# Patient Record
Sex: Female | Born: 1970 | Race: White | Hispanic: No | Smoking: Current every day smoker
Health system: Southern US, Community
[De-identification: ages and names within clinical notes are randomized; demographics above are authoritative.]

## PROBLEM LIST (undated history)

## (undated) DIAGNOSIS — G4700 Insomnia, unspecified: Secondary | ICD-10-CM

## (undated) DIAGNOSIS — E785 Hyperlipidemia, unspecified: Secondary | ICD-10-CM

## (undated) DIAGNOSIS — M797 Fibromyalgia: Secondary | ICD-10-CM

## (undated) DIAGNOSIS — E079 Disorder of thyroid, unspecified: Secondary | ICD-10-CM

## (undated) DIAGNOSIS — T7840XA Allergy, unspecified, initial encounter: Secondary | ICD-10-CM

## (undated) DIAGNOSIS — R609 Edema, unspecified: Secondary | ICD-10-CM

## (undated) DIAGNOSIS — G8929 Other chronic pain: Secondary | ICD-10-CM

## (undated) DIAGNOSIS — M1990 Unspecified osteoarthritis, unspecified site: Secondary | ICD-10-CM

## (undated) DIAGNOSIS — I10 Essential (primary) hypertension: Secondary | ICD-10-CM

## (undated) DIAGNOSIS — H409 Unspecified glaucoma: Secondary | ICD-10-CM

## (undated) DIAGNOSIS — M545 Low back pain, unspecified: Secondary | ICD-10-CM

## (undated) DIAGNOSIS — R600 Localized edema: Secondary | ICD-10-CM

## (undated) DIAGNOSIS — J45909 Unspecified asthma, uncomplicated: Secondary | ICD-10-CM

## (undated) HISTORY — DX: Essential (primary) hypertension: I10

## (undated) HISTORY — DX: Fibromyalgia: M79.7

## (undated) HISTORY — DX: Low back pain, unspecified: M54.50

## (undated) HISTORY — PX: OTHER SURGICAL HISTORY: SHX169

## (undated) HISTORY — DX: Allergy, unspecified, initial encounter: T78.40XA

## (undated) HISTORY — PX: CARPAL TUNNEL RELEASE: SHX101

## (undated) HISTORY — DX: Disorder of thyroid, unspecified: E07.9

## (undated) HISTORY — DX: Insomnia, unspecified: G47.00

## (undated) HISTORY — PX: ANTERIOR CRUCIATE LIGAMENT REPAIR: SHX115

## (undated) HISTORY — PX: CERVICAL SPINE SURGERY: SHX589

## (undated) HISTORY — DX: Other chronic pain: G89.29

## (undated) HISTORY — DX: Edema, unspecified: R60.9

## (undated) HISTORY — PX: ENDOMETRIAL ABLATION: SHX621

## (undated) HISTORY — DX: Hyperlipidemia, unspecified: E78.5

## (undated) HISTORY — DX: Unspecified asthma, uncomplicated: J45.909

## (undated) HISTORY — DX: Localized edema: R60.0

## (undated) HISTORY — PX: TUBAL LIGATION: SHX77

## (undated) HISTORY — DX: Low back pain: M54.5

---

## 2001-05-31 ENCOUNTER — Other Ambulatory Visit: Admission: RE | Admit: 2001-05-31 | Discharge: 2001-05-31 | Payer: Self-pay | Admitting: Family Medicine

## 2002-08-14 ENCOUNTER — Other Ambulatory Visit: Admission: RE | Admit: 2002-08-14 | Discharge: 2002-08-14 | Payer: Self-pay | Admitting: Family Medicine

## 2003-08-20 ENCOUNTER — Other Ambulatory Visit: Admission: RE | Admit: 2003-08-20 | Discharge: 2003-08-20 | Payer: Self-pay | Admitting: Family Medicine

## 2004-06-29 ENCOUNTER — Ambulatory Visit (HOSPITAL_COMMUNITY): Admission: RE | Admit: 2004-06-29 | Discharge: 2004-06-29 | Payer: Self-pay | Admitting: Family Medicine

## 2004-08-30 ENCOUNTER — Ambulatory Visit (HOSPITAL_COMMUNITY): Admission: RE | Admit: 2004-08-30 | Discharge: 2004-08-30 | Payer: Self-pay | Admitting: Family Medicine

## 2004-09-02 ENCOUNTER — Other Ambulatory Visit: Admission: RE | Admit: 2004-09-02 | Discharge: 2004-09-02 | Payer: Self-pay | Admitting: Family Medicine

## 2004-10-25 ENCOUNTER — Ambulatory Visit (HOSPITAL_COMMUNITY): Admission: RE | Admit: 2004-10-25 | Discharge: 2004-10-25 | Payer: Self-pay | Admitting: Gastroenterology

## 2005-09-13 ENCOUNTER — Other Ambulatory Visit: Admission: RE | Admit: 2005-09-13 | Discharge: 2005-09-13 | Payer: Self-pay | Admitting: Family Medicine

## 2005-12-28 ENCOUNTER — Encounter: Admission: RE | Admit: 2005-12-28 | Discharge: 2005-12-28 | Payer: Self-pay | Admitting: Neurological Surgery

## 2006-08-16 ENCOUNTER — Encounter: Admission: RE | Admit: 2006-08-16 | Discharge: 2006-08-16 | Payer: Self-pay | Admitting: Neurological Surgery

## 2006-08-30 ENCOUNTER — Ambulatory Visit (HOSPITAL_COMMUNITY): Admission: RE | Admit: 2006-08-30 | Discharge: 2006-08-31 | Payer: Self-pay | Admitting: Neurological Surgery

## 2006-10-02 ENCOUNTER — Encounter: Admission: RE | Admit: 2006-10-02 | Discharge: 2006-10-02 | Payer: Self-pay | Admitting: Neurological Surgery

## 2006-10-24 ENCOUNTER — Encounter: Admission: RE | Admit: 2006-10-24 | Discharge: 2006-10-24 | Payer: Self-pay | Admitting: Neurological Surgery

## 2006-12-04 ENCOUNTER — Encounter: Admission: RE | Admit: 2006-12-04 | Discharge: 2006-12-04 | Payer: Self-pay | Admitting: Neurological Surgery

## 2007-08-13 ENCOUNTER — Encounter: Admission: RE | Admit: 2007-08-13 | Discharge: 2007-08-13 | Payer: Self-pay | Admitting: Neurological Surgery

## 2007-11-30 ENCOUNTER — Encounter: Admission: RE | Admit: 2007-11-30 | Discharge: 2007-11-30 | Payer: Self-pay | Admitting: Neurological Surgery

## 2007-12-31 ENCOUNTER — Encounter: Admission: RE | Admit: 2007-12-31 | Discharge: 2007-12-31 | Payer: Self-pay | Admitting: Anesthesiology

## 2008-01-06 ENCOUNTER — Encounter: Admission: RE | Admit: 2008-01-06 | Discharge: 2008-01-06 | Payer: Self-pay | Admitting: Neurological Surgery

## 2008-01-17 ENCOUNTER — Ambulatory Visit (HOSPITAL_COMMUNITY): Admission: RE | Admit: 2008-01-17 | Discharge: 2008-01-18 | Payer: Self-pay | Admitting: Neurological Surgery

## 2008-02-12 ENCOUNTER — Encounter: Admission: RE | Admit: 2008-02-12 | Discharge: 2008-02-12 | Payer: Self-pay | Admitting: Neurological Surgery

## 2008-04-21 ENCOUNTER — Encounter: Admission: RE | Admit: 2008-04-21 | Discharge: 2008-04-21 | Payer: Self-pay | Admitting: Neurological Surgery

## 2009-01-02 ENCOUNTER — Encounter: Admission: RE | Admit: 2009-01-02 | Discharge: 2009-01-02 | Payer: Self-pay | Admitting: Neurological Surgery

## 2009-06-15 IMAGING — CR DG CERVICAL SPINE 1V
1 series · 1 of 1 positions shown · non-contrast
Comparison: Intraoperative C-arm spot film of 01/17/2008.

CLINICAL DATA: Neck pain, history of neck surgery 01/17/2008.

CERVICAL SPINE - 1 VIEW

[w c-spine lat]
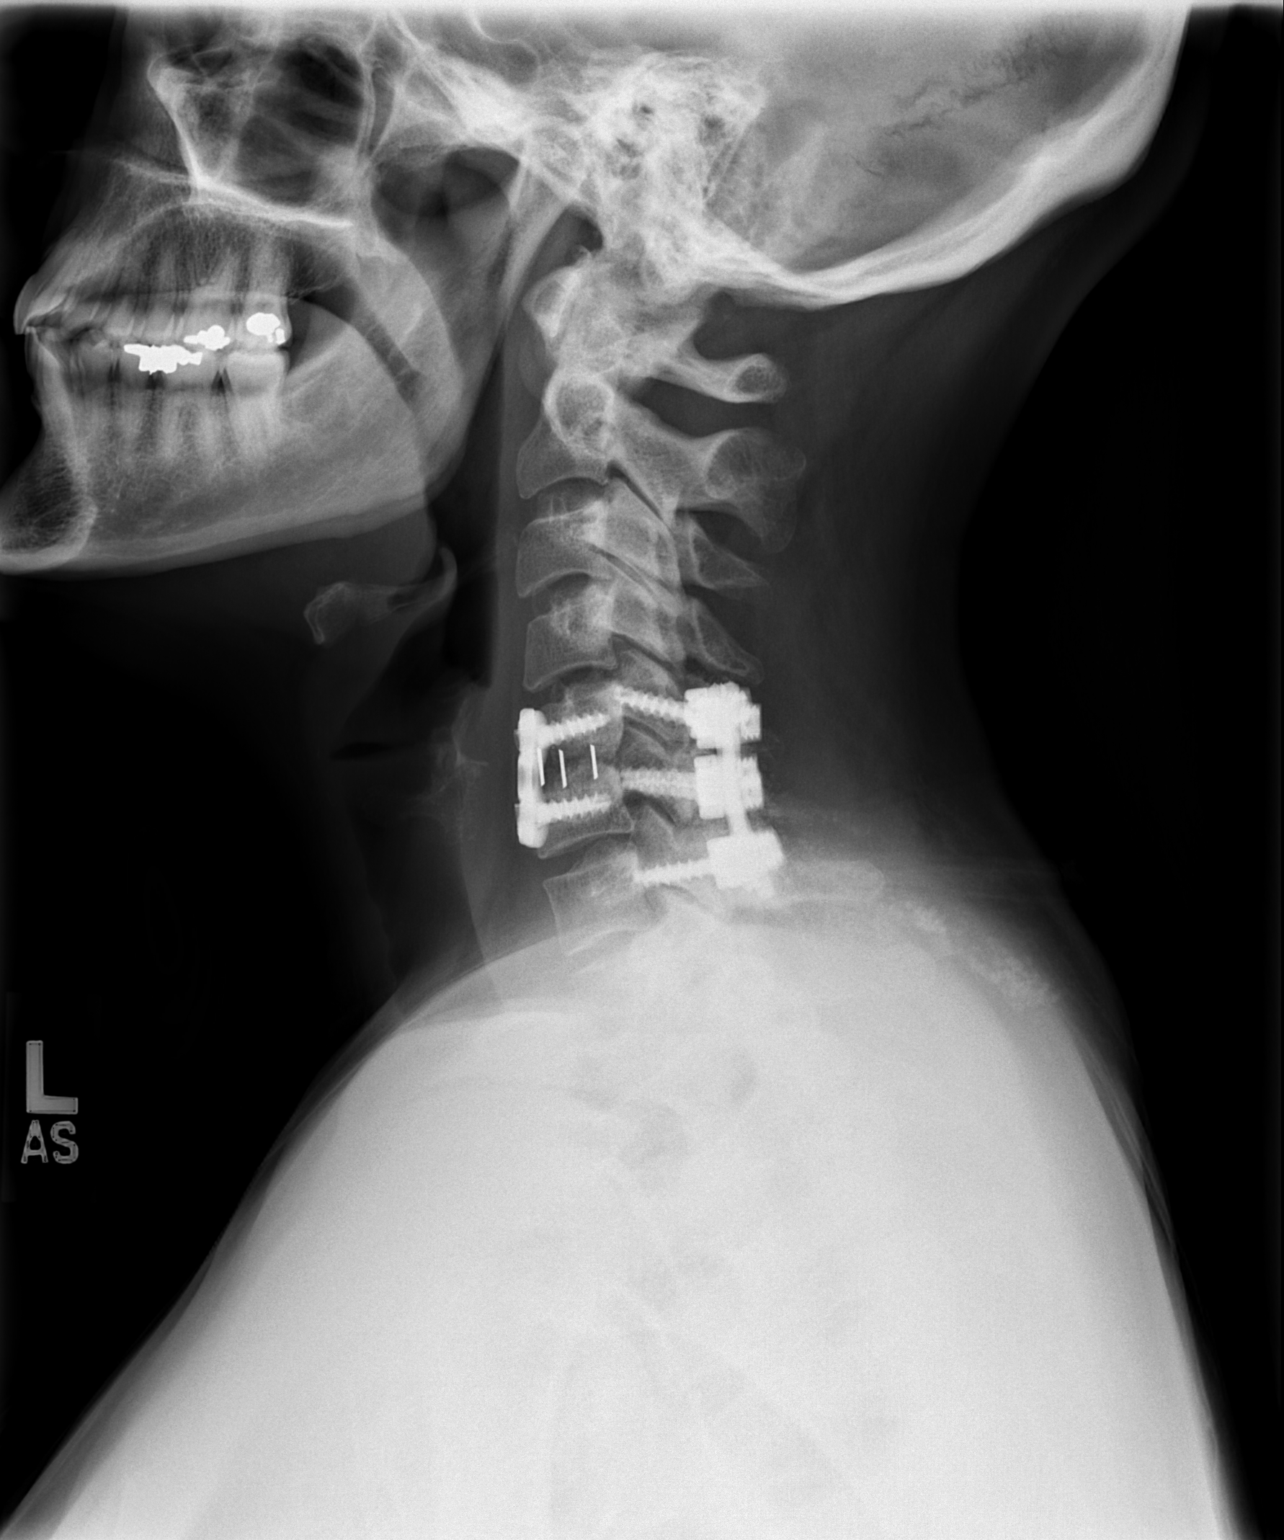

[1 of 1 positions shown; findings below may reference images not displayed]

FINDINGS: Anterior fusion is noted at C5-6.  The interbody fusion
plug is in good position as is the anterior metallic fixation
plate.  Posterior fusion is noted from C5-C7.  Normal alignment is
present.  No prevertebral soft tissue swelling is seen.
IMPRESSION: Anterior fusion at C5-6 and posterior fusion C5-6, C6-7.  Normal
alignment.

## 2010-01-29 ENCOUNTER — Encounter: Admission: RE | Admit: 2010-01-29 | Discharge: 2010-01-29 | Payer: Self-pay | Admitting: Neurological Surgery

## 2010-09-27 ENCOUNTER — Encounter: Admission: RE | Admit: 2010-09-27 | Discharge: 2010-09-27 | Payer: Self-pay | Admitting: *Deleted

## 2010-09-27 ENCOUNTER — Encounter: Admission: RE | Admit: 2010-09-27 | Discharge: 2010-09-27 | Payer: Self-pay | Admitting: Neurological Surgery

## 2011-03-22 NOTE — Op Note (Signed)
NAMESKYLAN, GIFT NO.:  [id number]   MEDICAL RECORD NO.:  [id number]          PATIENT TYPE:  OIB   LOCATION:  3536                         FACILITY:  [facility]   PHYSICIAN:  Tia Alert, MD     DATE OF BIRTH:  1971-04-13   DATE OF PROCEDURE:  01/17/2008  DATE OF DISCHARGE:  01/18/2008                               OPERATIVE REPORT   PREOPERATIVE DIAGNOSIS:  1. 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  2. Cervical disc herniation C6-C7.   POSTOPERATIVE DIAGNOSIS:  1. 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  2. Cervical disc herniation C6-C7.   PROCEDURE:  1. Posterior cervical fusion C5 to C7 utilizing local autograft and      Actifuse putty.  2. Segmental fixation C5 to C7 utilizing the Vertex lateral mass screw      system.   SURGEON:  Tia Alert, M.D.   ASSISTANT:  Reinaldo Meeker, M.D.   ANESTHESIA:  General endotracheal anesthesia.   COMPLICATIONS:  None apparent.   INDICATIONS FOR PROCEDURE:  [name] is a 40 year old female who had a  C5-C6 anterior cervical discectomy in the remote past.  She had return  of significant neck pain without significant radicular symptoms.  She  had a CT scan and MRI which showed a pseudoarthrosis at C5-C6, but also  a small herniated disc on the left at C6-C7.  I recommended a posterior  cervical arthrodesis with fixation to address her pseudoarthrosis, but  also wanted to include the C6-C7 level to hopefully prevent worsening of  her adjacent level disc herniation, but I did not believe she needed a  decompressive surgery because of her lack of radicular symptoms.  She  understood the risks, benefits, and expected outcome and wished to  proceed.   DESCRIPTION OF PROCEDURE:  The patient was taken to operating room and  after induction of adequate generalized endotracheal anesthesia, she was  affixed in a  three point Mayfield headrest and rolled into the prone  position on chest rolls and all pressure points were padded.  Her  cervical region was prepped with DuraPrep and draped in the usual  sterile fashion.  5 mL local anesthesia was injected and a dorsal  midline incision was made and carried down to the cervical fascia.  The  fascia was opened and the paraspinous musculature was taken down in  subperiosteal fashion to expose C5 to C7.  Intraoperative fluoroscopy  confirmed my level and then I dissected out to the lateral part of the  facet joints to expose C5, C6 and C7.  I located the lateral mass screw  entry zones 1 mm medial to the midline of the lateral mass, decorticated  this, and then drilled with a hand drill and a 14 mm drill guide in an  upward and an outward direction into the safe zone over each lateral  mass at C5, C6 and C7, and then placed 14 mm lateral mass screws into  the lateral masses of  C5, C6 and C7.  I then decorticated the lateral  masses, drilled the facet joints.  I then placed a lordotic rod into the  multiaxial screw heads of the vertex screws and locked these into  position with the locking caps and anti-torque device.  I then placed a  mixture of local autograft and Actifuse out over the lateral masses to  complete the arthrodesis after irrigating with saline solution  containing bacitracin.  We then got our final AP and lateral fluoroscopy  to check our construct and then closed the fascia with 0 Vicryl, the  subcutaneous and subcuticular tissue with 2-0 and 3-0 Vicryl, and closed  the skin with Benzoin and Steri-Strips.  The drapes removed, a sterile  dressing was applied.  The patient was taken out three point Mayfield  headrest, awakened from general anesthesia, and transferred to the  recovery room in stable condition.  At the end of the procedure, all  sponge, needle and instrument counts were correct.      Tia Alert, MD  Electronically  Signed     DSJ/MEDQ  D:  01/17/2008  T:  01/18/2008  Job:  [id number]

## 2011-03-25 NOTE — Op Note (Signed)
NAMEBHAVYA, Tammy Fuller               ACCOUNT NO.:  [id number]   MEDICAL RECORD NO.:  [id number]          PATIENT TYPE:  AMB   LOCATION:  SDS                          FACILITY:  [facility]   PHYSICIAN:  Tia Alert, MD     DATE OF BIRTH:  May 13, 1971   DATE OF PROCEDURE:  08/30/2006  DATE OF DISCHARGE:                                 OPERATIVE REPORT   PREOPERATIVE DIAGNOSIS:  Cervical disk herniation, C5-6 to the right, with  right C6 radiculopathy.   POSTOPERATIVE DIAGNOSIS:  Cervical disk herniation, C5-6 to the right, with  right C6 radiculopathy.   PROCEDURES:  1. Decompressive anterior cervical diskectomy. C5-6.  2. Anterior cervical arthrodesis, C5-6, utilizing a 6 mm PEEK interbody      cage packed with local autograft and Vitoss.  3. Anterior cervical plating, C5-6, utilizing a 23 mm Venture plate.   SURGEON:  Tia Alert, MD   ASSISTANT:  Donalee Citrin, MD   ANESTHESIA:  General endotracheal.   COMPLICATIONS:  None apparent.   INDICATIONS FOR PROCEDURE:  Tammy Fuller is a 40 year old white female who was  referred with neck pain with radiation into the right arm with numbness in  her thumb.  The pain followed a C6 distribution.  She had an MRI, which  showed a foraminal disk herniation at C5-6 on the right, and I recommended a  decompressive anterior cervical diskectomy, fusion with plating at C5-6 when  she failed medical management.  She understood the risks, benefits, and  expected outcome and wished to proceed.   DESCRIPTION OF PROCEDURE:  The patient was taken to the operating room and  after induction of adequate generalized endotracheal anesthesia, she was  placed in a supine position on the operating room table.  Her right anterior  cervical region was prepped with DuraPrep and then draped in the usual  sterile fashion.  Local anesthesia 3 mL was injected and small transverse  incision was made to the right of midline and carried down to the platysma,  which was  elevated, opened and undermined with Metzenbaum scissors.  I then  dissected a plane medial to the sternocleidomastoid muscle and internal  carotid artery and lateral to the trachea and esophagus to expose C5-6.  Intraoperative fluoroscopy confirmed my level and then longus colli muscles  were taken down bilaterally and the Shadow Line retractors were placed under  these.  The annulus was incised and the initial diskectomy was done with  pituitary rongeurs and curved curettes.  We then used the high-speed drill  to drill the endplates down to the level of the posterior longitudinal  ligament.  The drill shavings were saved in a mucous trap for later  arthrodesis.  They were mixed with Vitoss.  We then brought in the operating  microscope, opened the posterior longitudinal ligament with a nerve hook and  then removed it in a circumferential fashion while undercutting the bodies  C5 and C6 to decompress the central canal.  We identified the left C6 nerve  root and palpated near the foramen to assure adequate decompression with the  nerve hook and then we paid particular attention to the right side, where we  found several pieces of fragments sitting in the foramen at C5-6 the right,  compressing the right C6 nerve root.  These were removed with a nerve hook  and with the Kerrison punch.  A wide foraminotomy was performed at C5-6 on  the right side.  The C6 nerve was identified.  We marched until we met the  medial pedicle border and then marched along the superior surface of the  pedicle, identified the nerve root, and then decompressed it distally into  the foramen.  Once our decompression was complete, we palpated with the  Black nerve hook over the nerve root to assure adequate decompression of the  C6 nerve root on the right side.  We then irrigated with saline solution and  measured our interspace to be 6 mm.  We used a 6 mm PEEK interbody cage  packed with local autograft mixed with the  Vitoss and tapped this into  position at C5-6.  We then used a 23 mm Venture plate and placed two 13 mm  variable-angle screws into the bodies of C5 and C6, and these were locked  into position by locking mechanism in the plate.  We then checked our  construct with fluoroscopy.  We irrigated with saline solution containing  bacitracin, dried all bleeding points with bipolar cautery, and once  meticulous hemostasis was achieved closed the platysma with 3-0 Vicryl,  closed the subcutaneous and subcuticular tissue with 3-0 Vicryl and closed  the skin with Benzoin and Steri-Strips.  The drapes were removed.  A sterile  dressing was applied.  The patient was awakened from general anesthesia and  transferred to the recovery room in stable condition.  At the end of the  procedure all sponge, needle and instruments counts were correct.      Tia Alert, MD  Electronically Signed     DSJ/MEDQ  D:  08/30/2006  T:  08/31/2006  Job:  [id number]

## 2011-08-01 LAB — CBC
MCHC: 35.2
RBC: 4.42
RDW: 12.8

## 2011-08-01 LAB — DIFFERENTIAL
Basophils Absolute: 0.1
Basophils Relative: 1
Lymphocytes Relative: 30
Neutro Abs: 4.6
Neutrophils Relative %: 62

## 2011-08-01 LAB — BASIC METABOLIC PANEL
CO2: 28
Calcium: 9.6
Creatinine, Ser: 0.74
GFR calc Af Amer: >60
GFR calc non Af Amer: >60

## 2011-08-01 LAB — APTT: aPTT: 30

## 2011-08-01 LAB — PROTIME-INR: INR: 0.9

## 2013-04-15 ENCOUNTER — Telehealth: Payer: Self-pay | Admitting: Nurse Practitioner

## 2013-04-15 MED ORDER — LINDANE 1 % EX LOTN: TOPICAL_LOTION | Freq: Once | CUTANEOUS | Status: DC

## 2013-04-15 NOTE — Telephone Encounter (Signed)
Mmm to address 

## 2013-04-15 NOTE — Telephone Encounter (Signed)
rx sent to pharmacy

## 2013-04-16 NOTE — Telephone Encounter (Signed)
Parent aware of med at [facility]

## 2013-05-20 ENCOUNTER — Encounter: Payer: Self-pay | Admitting: Nurse Practitioner

## 2013-05-20 ENCOUNTER — Ambulatory Visit (INDEPENDENT_AMBULATORY_CARE_PROVIDER_SITE_OTHER): Payer: Medicaid Other | Admitting: Nurse Practitioner

## 2013-05-20 VITALS — BP 130/92 | HR 84 | Temp 98.7°F | Ht 70.0 in | Wt 206.0 lb

## 2013-05-20 DIAGNOSIS — G8929 Other chronic pain: Secondary | ICD-10-CM

## 2013-05-20 DIAGNOSIS — J449 Chronic obstructive pulmonary disease, unspecified: Secondary | ICD-10-CM

## 2013-05-20 DIAGNOSIS — IMO0001 Reserved for inherently not codable concepts without codable children: Secondary | ICD-10-CM | POA: Insufficient documentation

## 2013-05-20 DIAGNOSIS — E039 Hypothyroidism, unspecified: Secondary | ICD-10-CM

## 2013-05-20 DIAGNOSIS — M549 Dorsalgia, unspecified: Secondary | ICD-10-CM

## 2013-05-20 DIAGNOSIS — Z124 Encounter for screening for malignant neoplasm of cervix: Secondary | ICD-10-CM

## 2013-05-20 DIAGNOSIS — Z0000 Encounter for general adult medical examination without abnormal findings: Secondary | ICD-10-CM

## 2013-05-20 DIAGNOSIS — I10 Essential (primary) hypertension: Secondary | ICD-10-CM | POA: Insufficient documentation

## 2013-05-20 DIAGNOSIS — Z01419 Encounter for gynecological examination (general) (routine) without abnormal findings: Secondary | ICD-10-CM

## 2013-05-20 DIAGNOSIS — G4700 Insomnia, unspecified: Secondary | ICD-10-CM

## 2013-05-20 DIAGNOSIS — E785 Hyperlipidemia, unspecified: Secondary | ICD-10-CM | POA: Insufficient documentation

## 2013-05-20 LAB — POCT CBC
Lymph, poc: 2.3 (ref 0.6–3.4)
MCH, POC: 35.8 pg — AB (ref 27–31.2)
MCV: 97.9 fL — AB (ref 80–97)
Platelet Count, POC: 280 10*3/uL (ref 142–424)
RBC: 4.2 M/uL (ref 4.04–5.48)
RDW, POC: 13.1 %
WBC: 6.7 10*3/uL (ref 4.6–10.2)

## 2013-05-20 LAB — POCT URINALYSIS DIPSTICK
Blood, UA: NEGATIVE
Nitrite, UA: NEGATIVE
Protein, UA: NEGATIVE
Urobilinogen, UA: NEGATIVE
pH, UA: 6.5

## 2013-05-20 LAB — POCT UA - MICROSCOPIC ONLY
Casts, Ur, LPF, POC: NEGATIVE
Crystals, Ur, HPF, POC: NEGATIVE
Yeast, UA: NEGATIVE

## 2013-05-20 MED ORDER — FENOFIBRATE 145 MG PO TABS: 145.0000 mg | ORAL_TABLET | Freq: Every day | ORAL | Status: DC

## 2013-05-20 MED ORDER — ALBUTEROL SULFATE HFA 108 (90 BASE) MCG/ACT IN AERS: 2.0000 | INHALATION_SPRAY | Freq: Four times a day (QID) | RESPIRATORY_TRACT | Status: AC | PRN

## 2013-05-20 MED ORDER — MELOXICAM 15 MG PO TABS: 15.0000 mg | ORAL_TABLET | Freq: Every day | ORAL | Status: DC

## 2013-05-20 MED ORDER — LEVOTHYROXINE SODIUM 125 MCG PO TABS: 250.0000 ug | ORAL_TABLET | Freq: Every day | ORAL | Status: AC

## 2013-05-20 MED ORDER — LISINOPRIL-HYDROCHLOROTHIAZIDE 20-12.5 MG PO TABS: 1.0000 | ORAL_TABLET | Freq: Every day | ORAL | Status: AC

## 2013-05-20 MED ORDER — CYCLOBENZAPRINE HCL 10 MG PO TABS: 10.0000 mg | ORAL_TABLET | Freq: Three times a day (TID) | ORAL | Status: DC | PRN

## 2013-05-20 MED ORDER — ZOLPIDEM TARTRATE 10 MG PO TABS: 10.0000 mg | ORAL_TABLET | Freq: Every evening | ORAL | Status: DC | PRN

## 2013-05-20 NOTE — Progress Notes (Signed)
Subjective:    Patient ID: Tammy Fuller, female    DOB: 1970/12/11, 42 y.o.   MRN: [id number]  HPI Patient here today for CP and PAP- SHe is doing well. SHe has not been talking her meds as rx due to expense and no insurance- She has been dieting however and has lost 70 lbs. She has no compliants today. Patient Active Problem List   Diagnosis Date Noted  . Hyperlipidemia 05/20/2013  . Hypertension 05/20/2013  . Hypothyroidism 05/20/2013  . 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 albuterol (PROVENTIL HFA;VENTOLIN HFA) 108 (90 BASE) MCG/ACT inhaler Inhale 2 puffs into the lungs every 6 (six) hours as needed for wheezing.      . cyclobenzaprine (FLEXERIL) 10 MG tablet Take 10 mg by mouth 3 (three) times daily as needed for muscle spasms.      . Fenofibrate (LIPOFEN PO) Take 40 mg by mouth.      . furosemide (LASIX) 20 MG tablet Take 20 mg by mouth as needed.      Marland Kitchen HYDROcodone-acetaminophen (NORCO) 7.5-325 MG per tablet Take 1 tablet by mouth every 6 (six) hours as needed for pain.      Marland Kitchen levothyroxine (SYNTHROID, LEVOTHROID) 125 MCG tablet Take 250 mcg by mouth daily before breakfast.      . lisinopril-hydrochlorothiazide (PRINZIDE,ZESTORETIC) 20-12.5 MG per tablet Take 1 tablet by mouth daily.      . meloxicam (MOBIC) 15 MG tablet Take 15 mg by mouth daily.      Marland Kitchen zolpidem (AMBIEN) 10 MG tablet Take 10 mg by mouth at bedtime as needed for sleep.      . [DISCONTINUED] lindane lotion (KWELL) 1 % Apply topically once.  30 mL  0   No facility-administered encounter medications on file as of 05/20/2013.       Review of Systems  All other systems reviewed and are negative.       Objective:   Physical Exam  Constitutional: She is oriented to person, place, and time. She appears well-developed and well-nourished.  HENT:  Head: Normocephalic.  Right Ear: Hearing, tympanic membrane, external ear and ear canal  normal.  Left Ear: Hearing, tympanic membrane, external ear and ear canal normal.  Nose: Nose normal.  Mouth/Throat: Uvula is midline and oropharynx is clear and moist.  Eyes: Conjunctivae and EOM are normal. Pupils are equal, round, and reactive to light.  Neck: Normal range of motion and full passive range of motion without pain. Neck supple. No JVD present. Carotid bruit is not present. No mass and no thyromegaly present.  Cardiovascular: Normal rate, normal heart sounds and intact distal pulses.   No murmur heard. Pulmonary/Chest: Effort normal and breath sounds normal. Right breast exhibits no inverted nipple, no mass, no nipple discharge, no skin change and no tenderness. Left breast exhibits no inverted nipple, no mass, no nipple discharge, no skin change and no tenderness.  Abdominal: Soft. Bowel sounds are normal. She exhibits no mass. There is no tenderness.  Genitourinary: Vagina normal and uterus normal. No breast swelling, tenderness, discharge or bleeding.  bimanual exam-No adnexal masses or tenderness.  Cervical os parous and pink  Musculoskeletal: Normal range of motion.  Lymphadenopathy:    She has no cervical adenopathy.  Neurological: She is alert and oriented to person, place, and time.  Skin: Skin is warm and dry.  Psychiatric: She has a normal mood and affect. Her behavior is normal. Judgment and  thought content normal.    BP 130/92  Pulse 84  Temp(Src) 98.7 F (37.1 C) (Oral)  Ht 5\' 10"  (1.778 m)  Wt 206 lb (93.441 kg)  BMI 29.56 kg/m2       Assessment & Plan:  1. Annual physical exam  - POCT urinalysis dipstick - POCT UA - Microscopic Only - POCT CBC  2. Hyperlipidemia Low fat diet and exercise - NMR Lipoprofile with Lipids - fenofibrate (TRICOR) 145 MG tablet; Take 1 tablet (145 mg total) by mouth daily.  Dispense: 30 tablet; Refill: 5  3. Hypertension Low NA+ diet - COMPLETE METABOLIC PANEL WITH GFR - lisinopril-hydrochlorothiazide  (PRINZIDE,ZESTORETIC) 20-12.5 MG per tablet; Take 1 tablet by mouth daily.  Dispense: 30 tablet; Refill: 5  4. Hypothyroidism Must take meds daily or they will not work - Thyroid Panel With TSH - levothyroxine (SYNTHROID, LEVOTHROID) 125 MCG tablet; Take 2 tablets (250 mcg total) by mouth daily before breakfast.  Dispense: 30 tablet; Refill: 5  5. COPD bronchitis No smoking - albuterol (PROVENTIL HFA;VENTOLIN HFA) 108 (90 BASE) MCG/ACT inhaler; Inhale 2 puffs into the lungs every 6 (six) hours as needed for wheezing.  Dispense: 1 Inhaler; Refill: 5  6. Back pain, chronic exercise - meloxicam (MOBIC) 15 MG tablet; Take 1 tablet (15 mg total) by mouth daily.  Dispense: 30 tablet; Refill: 5  7. Insomnia Bedtime ritual - zolpidem (AMBIEN) 10 MG tablet; Take 1 tablet (10 mg total) by mouth at bedtime as needed for sleep.  Dispense: 30 tablet; Refill: 2  8. Encounter for routine gynecological examination  - Pap IG, CT/NG w/ reflex HPV when ASC-U  Mary-Margaret Daphine Deutscher, FNP

## 2013-05-20 NOTE — Patient Instructions (Signed)
Smoking Cessation Quitting smoking is important to your health and has many advantages. However, it is not always easy to quit since nicotine is a very addictive drug. Often times, people try 3 times or more before being able to quit. This document explains the best ways for you to prepare to quit smoking. Quitting takes hard work and a lot of effort, but you can do it. ADVANTAGES OF QUITTING SMOKING  You will live longer, feel better, and live better.  Your body will feel the impact of quitting smoking almost immediately.  Within 20 minutes, blood pressure decreases. Your pulse returns to its normal level.  After 8 hours, carbon monoxide levels in the blood return to normal. Your oxygen level increases.  After 24 hours, the chance of having a heart attack starts to decrease. Your breath, hair, and body stop smelling like smoke.  After 48 hours, damaged nerve endings begin to recover. Your sense of taste and smell improve.  After 72 hours, the body is virtually free of nicotine. Your bronchial tubes relax and breathing becomes easier.  After 2 to 12 weeks, lungs can hold more air. Exercise becomes easier and circulation improves.  The risk of having a heart attack, stroke, cancer, or lung disease is greatly reduced.  After 1 year, the risk of coronary heart disease is cut in half.  After 5 years, the risk of stroke falls to the same as a nonsmoker.  After 10 years, the risk of lung cancer is cut in half and the risk of other cancers decreases significantly.  After 15 years, the risk of coronary heart disease drops, usually to the level of a nonsmoker.  If you are pregnant, quitting smoking will improve your chances of having a healthy baby.  The people you live with, especially any children, will be healthier.  You will have extra money to spend on things other than cigarettes. QUESTIONS TO THINK ABOUT BEFORE ATTEMPTING TO QUIT You may want to talk about your answers with your  caregiver.  Why do you want to quit?  If you tried to quit in the past, what helped and what did not?  What will be the most difficult situations for you after you quit? How will you plan to handle them?  Who can help you through the tough times? Your family? Friends? A caregiver?  What pleasures do you get from smoking? What ways can you still get pleasure if you quit? Here are some questions to ask your caregiver:  How can you help me to be successful at quitting?  What medicine do you think would be best for me and how should I take it?  What should I do if I need more help?  What is smoking withdrawal like? How can I get information on withdrawal? GET READY  Set a quit date.  Change your environment by getting rid of all cigarettes, ashtrays, matches, and lighters in your home, car, or work. Do not let people smoke in your home.  Review your past attempts to quit. Think about what worked and what did not. GET SUPPORT AND ENCOURAGEMENT You have a better chance of being successful if you have help. You can get support in many ways.  Tell your family, friends, and co-workers that you are going to quit and need their support. Ask them not to smoke around you.  Get individual, group, or telephone counseling and support. Programs are available at local hospitals and health centers. Call your local health department for   information about programs in your area.  Spiritual beliefs and practices may help some smokers quit.  Download a "quit meter" on your computer to keep track of quit statistics, such as how long you have gone without smoking, cigarettes not smoked, and money saved.  Get a self-help book about quitting smoking and staying off of tobacco. LEARN NEW SKILLS AND BEHAVIORS  Distract yourself from urges to smoke. Talk to someone, go for a walk, or occupy your time with a task.  Change your normal routine. Take a different route to work. Drink tea instead of coffee.  Eat breakfast in a different place.  Reduce your stress. Take a hot bath, exercise, or read a book.  Plan something enjoyable to do every day. Reward yourself for not smoking.  Explore interactive web-based programs that specialize in helping you quit. GET MEDICINE AND USE IT CORRECTLY Medicines can help you stop smoking and decrease the urge to smoke. Combining medicine with the above behavioral methods and support can greatly increase your chances of successfully quitting smoking.  Nicotine replacement therapy helps deliver nicotine to your body without the negative effects and risks of smoking. Nicotine replacement therapy includes nicotine gum, lozenges, inhalers, nasal sprays, and skin patches. Some may be available over-the-counter and others require a prescription.  Antidepressant medicine helps people abstain from smoking, but how this works is unknown. This medicine is available by prescription.  Nicotinic receptor partial agonist medicine simulates the effect of nicotine in your brain. This medicine is available by prescription. Ask your caregiver for advice about which medicines to use and how to use them based on your health history. Your caregiver will tell you what side effects to look out for if you choose to be on a medicine or therapy. Carefully read the information on the package. Do not use any other product containing nicotine while using a nicotine replacement product.  RELAPSE OR DIFFICULT SITUATIONS Most relapses occur within the first 3 months after quitting. Do not be discouraged if you start smoking again. Remember, most people try several times before finally quitting. You may have symptoms of withdrawal because your body is used to nicotine. You may crave cigarettes, be irritable, feel very hungry, cough often, get headaches, or have difficulty concentrating. The withdrawal symptoms are only temporary. They are strongest when you first quit, but they will go away within  10 14 days. To reduce the chances of relapse, try to:  Avoid drinking alcohol. Drinking lowers your chances of successfully quitting.  Reduce the amount of caffeine you consume. Once you quit smoking, the amount of caffeine in your body increases and can give you symptoms, such as a rapid heartbeat, sweating, and anxiety.  Avoid smokers because they can make you want to smoke.  Do not let weight gain distract you. Many smokers will gain weight when they quit, usually less than 10 pounds. Eat a healthy diet and stay active. You can always lose the weight gained after you quit.  Find ways to improve your mood other than smoking. FOR MORE INFORMATION  www.smokefree.gov  Document Released: 10/18/2001 Document Revised: 04/24/2012 Document Reviewed: 02/02/2012 [employer] Patient Information 2014 [employer].  

## 2013-05-21 LAB — NMR LIPOPROFILE WITH LIPIDS
HDL Particle Number: 38.9 umol/L (ref >=30.5)
HDL-C: 64 mg/dL (ref >=40)
Large HDL-P: 10.4 umol/L (ref >=4.8)
Large VLDL-P: 3.6 nmol/L — ABNORMAL HIGH (ref <=2.7)
Triglycerides: 131 mg/dL (ref <150)

## 2013-05-21 LAB — COMPLETE METABOLIC PANEL WITH GFR
ALT: 22 U/L (ref 0–35)
Albumin: 4.8 g/dL (ref 3.5–5.2)
Alkaline Phosphatase: 45 U/L (ref 39–117)
BUN: 7 mg/dL (ref 6–23)
Chloride: 102 mEq/L (ref 96–112)
GFR, Est African American: 78 mL/min
GFR, Est Non African American: 68 mL/min
Total Bilirubin: 0.7 mg/dL (ref 0.3–1.2)
Total Protein: 7.2 g/dL (ref 6.0–8.3)

## 2013-05-21 LAB — PAP IG, CT-NG, RFX HPV ASCU

## 2013-05-21 LAB — THYROID PANEL WITH TSH
T3 Uptake: 27.3 % (ref 22.5–37.0)
T4, Total: 1.4 ug/dL — ABNORMAL LOW (ref 5.0–12.5)
TSH: 94.091 u[IU]/mL — ABNORMAL HIGH (ref 0.350–4.500)

## 2013-05-22 ENCOUNTER — Other Ambulatory Visit: Payer: Self-pay | Admitting: Nurse Practitioner

## 2013-05-22 MED ORDER — SIMVASTATIN 40 MG PO TABS: 40.0000 mg | ORAL_TABLET | Freq: Every day | ORAL | Status: AC

## 2013-05-22 NOTE — Addendum Note (Signed)
Addended by: Orma Render F on: 05/22/2013 11:51 AM   Modules accepted: Orders

## 2013-05-24 ENCOUNTER — Telehealth: Payer: Self-pay | Admitting: *Deleted

## 2013-05-24 MED ORDER — FENOFIBRATE 145 MG PO TABS: 145.0000 mg | ORAL_TABLET | Freq: Every day | ORAL | Status: DC

## 2013-05-24 NOTE — Telephone Encounter (Signed)
Ins will not cover fenofibrate unless pt has tried lopid, tricor or trilipix,  At least two of these and failed.  Can you help? Thanks

## 2013-05-24 NOTE — Telephone Encounter (Signed)
Changed to tricor- sent rx in to pharmacy

## 2013-05-27 NOTE — Telephone Encounter (Signed)
Pt aware.

## 2013-09-30 ENCOUNTER — Other Ambulatory Visit: Payer: Self-pay | Admitting: Neurological Surgery

## 2013-11-04 ENCOUNTER — Encounter (HOSPITAL_COMMUNITY): Payer: Self-pay | Admitting: Pharmacy Technician

## 2013-11-12 ENCOUNTER — Encounter (HOSPITAL_COMMUNITY): Payer: Self-pay | Admitting: *Deleted

## 2013-11-20 MED ORDER — DEXAMETHASONE SODIUM PHOSPHATE 10 MG/ML IJ SOLN
10.0000 mg | INTRAMUSCULAR | Status: AC
  Administered 2013-11-21: 10 mg via INTRAVENOUS
  Filled 2013-11-20: qty 1

## 2013-11-20 MED ORDER — CEFAZOLIN SODIUM-DEXTROSE 2-3 GM-% IV SOLR
2.0000 g | INTRAVENOUS | Status: AC
  Administered 2013-11-21: 2 g via INTRAVENOUS
  Filled 2013-11-20: qty 50

## 2013-11-20 NOTE — Progress Notes (Signed)
I spoke with patient and informed her of new arrival time of 0800.

## 2013-11-21 ENCOUNTER — Inpatient Hospital Stay (HOSPITAL_COMMUNITY): Payer: Commercial Managed Care - PPO

## 2013-11-21 ENCOUNTER — Inpatient Hospital Stay (HOSPITAL_COMMUNITY): Payer: Commercial Managed Care - PPO | Admitting: Critical Care Medicine

## 2013-11-21 ENCOUNTER — Encounter (HOSPITAL_COMMUNITY): Payer: Commercial Managed Care - PPO | Admitting: Critical Care Medicine

## 2013-11-21 ENCOUNTER — Encounter (HOSPITAL_COMMUNITY)
Admission: RE | Disposition: A | Discharge status: Home / Self Care | Payer: Self-pay | Source: Ambulatory Visit | Attending: Neurological Surgery

## 2013-11-21 ENCOUNTER — Inpatient Hospital Stay (HOSPITAL_COMMUNITY)
Admission: RE | Admit: 2013-11-21 | Discharge: 2013-11-22 | DRG: 460 | Disposition: A | Discharge status: Home / Self Care | Payer: Commercial Managed Care - PPO | Source: Ambulatory Visit | Attending: Neurological Surgery | Admitting: Neurological Surgery

## 2013-11-21 ENCOUNTER — Encounter (HOSPITAL_COMMUNITY): Payer: Self-pay | Admitting: *Deleted

## 2013-11-21 DIAGNOSIS — Z833 Family history of diabetes mellitus: Secondary | ICD-10-CM

## 2013-11-21 DIAGNOSIS — E785 Hyperlipidemia, unspecified: Secondary | ICD-10-CM | POA: Diagnosis present

## 2013-11-21 DIAGNOSIS — Z8249 Family history of ischemic heart disease and other diseases of the circulatory system: Secondary | ICD-10-CM

## 2013-11-21 DIAGNOSIS — G8929 Other chronic pain: Secondary | ICD-10-CM | POA: Diagnosis present

## 2013-11-21 DIAGNOSIS — E039 Hypothyroidism, unspecified: Secondary | ICD-10-CM | POA: Diagnosis present

## 2013-11-21 DIAGNOSIS — M7130 Other bursal cyst, unspecified site: Secondary | ICD-10-CM | POA: Diagnosis present

## 2013-11-21 DIAGNOSIS — Z9851 Tubal ligation status: Secondary | ICD-10-CM

## 2013-11-21 DIAGNOSIS — Z886 Allergy status to analgesic agent status: Secondary | ICD-10-CM

## 2013-11-21 DIAGNOSIS — Z981 Arthrodesis status: Secondary | ICD-10-CM

## 2013-11-21 DIAGNOSIS — Z79899 Other long term (current) drug therapy: Secondary | ICD-10-CM

## 2013-11-21 DIAGNOSIS — J45909 Unspecified asthma, uncomplicated: Secondary | ICD-10-CM | POA: Diagnosis present

## 2013-11-21 DIAGNOSIS — Q762 Congenital spondylolisthesis: Principal | ICD-10-CM

## 2013-11-21 DIAGNOSIS — F17200 Nicotine dependence, unspecified, uncomplicated: Secondary | ICD-10-CM | POA: Diagnosis present

## 2013-11-21 DIAGNOSIS — I10 Essential (primary) hypertension: Secondary | ICD-10-CM | POA: Diagnosis present

## 2013-11-21 DIAGNOSIS — G4700 Insomnia, unspecified: Secondary | ICD-10-CM | POA: Diagnosis present

## 2013-11-21 DIAGNOSIS — H409 Unspecified glaucoma: Secondary | ICD-10-CM | POA: Diagnosis present

## 2013-11-21 DIAGNOSIS — IMO0001 Reserved for inherently not codable concepts without codable children: Secondary | ICD-10-CM | POA: Diagnosis present

## 2013-11-21 HISTORY — PX: MAXIMUM ACCESS (MAS)POSTERIOR LUMBAR INTERBODY FUSION (PLIF) 1 LEVEL: SHX6368

## 2013-11-21 HISTORY — DX: Unspecified glaucoma: H40.9

## 2013-11-21 HISTORY — DX: Unspecified osteoarthritis, unspecified site: M19.90

## 2013-11-21 LAB — CBC WITH DIFFERENTIAL/PLATELET
BASOS PCT: 0 % (ref 0–1)
Basophils Absolute: 0 10*3/uL (ref 0.0–0.1)
EOS PCT: 2 % (ref 0–5)
Eosinophils Absolute: 0.1 10*3/uL (ref 0.0–0.7)
HEMATOCRIT: 43.2 % (ref 36.0–46.0)
Hemoglobin: 15.1 g/dL — ABNORMAL HIGH (ref 12.0–15.0)
LYMPHS PCT: 32 % (ref 12–46)
Lymphs Abs: 2.3 10*3/uL (ref 0.7–4.0)
MCH: 33.7 pg (ref 26.0–34.0)
MCHC: 35 g/dL (ref 30.0–36.0)
MCV: 96.4 fL (ref 78.0–100.0)
MONO ABS: 0.4 10*3/uL (ref 0.1–1.0)
Monocytes Relative: 6 % (ref 3–12)
NEUTROS ABS: 4.3 10*3/uL (ref 1.7–7.7)
Neutrophils Relative %: 60 % (ref 43–77)
PLATELETS: 278 10*3/uL (ref 150–400)
RBC: 4.48 MIL/uL (ref 3.87–5.11)
RDW: 12.9 % (ref 11.5–15.5)
WBC: 7.2 10*3/uL (ref 4.0–10.5)

## 2013-11-21 LAB — BASIC METABOLIC PANEL
BUN: 13 mg/dL (ref 6–23)
CALCIUM: 9.2 mg/dL (ref 8.4–10.5)
CO2: 27 mEq/L (ref 19–32)
Chloride: 101 mEq/L (ref 96–112)
Creatinine, Ser: 0.73 mg/dL (ref 0.50–1.10)
GFR calc non Af Amer: >90 mL/min (ref >90)
Glucose, Bld: 99 mg/dL (ref 70–99)
Potassium: 5.2 mEq/L (ref 3.7–5.3)
Sodium: 139 mEq/L (ref 137–147)

## 2013-11-21 LAB — PROTIME-INR
INR: 0.91 (ref 0.00–1.49)
Prothrombin Time: 12.1 seconds (ref 11.6–15.2)

## 2013-11-21 LAB — SURGICAL PCR SCREEN
MRSA, PCR: NEGATIVE
STAPHYLOCOCCUS AUREUS: POSITIVE — AB

## 2013-11-21 LAB — ABO/RH: ABO/RH(D): O POS

## 2013-11-21 LAB — TYPE AND SCREEN
ABO/RH(D): O POS
ANTIBODY SCREEN: NEGATIVE

## 2013-11-21 LAB — HCG, SERUM, QUALITATIVE: Preg, Serum: NEGATIVE

## 2013-11-21 SURGERY — FOR MAXIMUM ACCESS (MAS) POSTERIOR LUMBAR INTERBODY FUSION (PLIF) 1 LEVEL
Anesthesia: General | Site: Spine Lumbar

## 2013-11-21 MED ORDER — PROPOFOL 10 MG/ML IV BOLUS
INTRAVENOUS | Status: DC | PRN
  Administered 2013-11-21: 180 mg via INTRAVENOUS
  Administered 2013-11-21: 20 mg via INTRAVENOUS

## 2013-11-21 MED ORDER — HYDROMORPHONE HCL PF 1 MG/ML IJ SOLN
INTRAMUSCULAR | Status: AC
  Filled 2013-11-21: qty 1

## 2013-11-21 MED ORDER — SODIUM CHLORIDE 0.9 % IR SOLN
Status: DC | PRN
  Administered 2013-11-21: 12:00:00

## 2013-11-21 MED ORDER — OXYCODONE-ACETAMINOPHEN 5-325 MG PO TABS
1.0000 | ORAL_TABLET | ORAL | Status: DC | PRN
  Administered 2013-11-21 – 2013-11-22 (×4): 2 via ORAL
  Filled 2013-11-21 (×4): qty 2

## 2013-11-21 MED ORDER — DEXAMETHASONE SODIUM PHOSPHATE 4 MG/ML IJ SOLN
4.0000 mg | Freq: Four times a day (QID) | INTRAMUSCULAR | Status: DC
Start: 2013-11-21 — End: 2013-11-22
  Filled 2013-11-21 (×4): qty 1

## 2013-11-21 MED ORDER — CEFAZOLIN SODIUM 1-5 GM-% IV SOLN
1.0000 g | Freq: Three times a day (TID) | INTRAVENOUS | Status: AC
  Administered 2013-11-21 – 2013-11-22 (×2): 1 g via INTRAVENOUS
  Filled 2013-11-21 (×2): qty 50

## 2013-11-21 MED ORDER — CYCLOBENZAPRINE HCL 10 MG PO TABS
10.0000 mg | ORAL_TABLET | Freq: Three times a day (TID) | ORAL | Status: DC | PRN
  Administered 2013-11-21 – 2013-11-22 (×2): 10 mg via ORAL
  Filled 2013-11-21 (×2): qty 1

## 2013-11-21 MED ORDER — MUPIROCIN 2 % EX OINT
1.0000 "application " | TOPICAL_OINTMENT | Freq: Two times a day (BID) | CUTANEOUS | Status: DC
  Administered 2013-11-21: 1 via NASAL

## 2013-11-21 MED ORDER — LEVOTHYROXINE SODIUM 125 MCG PO TABS
250.0000 ug | ORAL_TABLET | Freq: Every day | ORAL | Status: DC
  Filled 2013-11-21 (×2): qty 2

## 2013-11-21 MED ORDER — ACETAMINOPHEN 325 MG PO TABS: 650.0000 mg | ORAL_TABLET | ORAL | Status: DC | PRN

## 2013-11-21 MED ORDER — SODIUM CHLORIDE 0.9 % IJ SOLN: 3.0000 mL | INTRAMUSCULAR | Status: DC | PRN

## 2013-11-21 MED ORDER — MUPIROCIN 2 % EX OINT
TOPICAL_OINTMENT | Freq: Two times a day (BID) | CUTANEOUS | Status: DC
  Filled 2013-11-21: qty 22

## 2013-11-21 MED ORDER — SODIUM CHLORIDE 0.9 % IV SOLN: 250.0000 mL | INTRAVENOUS | Status: DC

## 2013-11-21 MED ORDER — SUCCINYLCHOLINE CHLORIDE 20 MG/ML IJ SOLN
INTRAMUSCULAR | Status: DC | PRN
  Administered 2013-11-21: 120 mg via INTRAVENOUS

## 2013-11-21 MED ORDER — ONDANSETRON HCL 4 MG/2ML IJ SOLN
INTRAMUSCULAR | Status: DC | PRN
Start: 2013-11-21 — End: 2013-11-21
  Administered 2013-11-21: 4 mg via INTRAVENOUS

## 2013-11-21 MED ORDER — THROMBIN 20000 UNITS EX SOLR
CUTANEOUS | Status: DC | PRN
  Administered 2013-11-21: 12:00:00 via TOPICAL

## 2013-11-21 MED ORDER — ALBUTEROL SULFATE (2.5 MG/3ML) 0.083% IN NEBU: 2.5000 mg | INHALATION_SOLUTION | RESPIRATORY_TRACT | Status: DC | PRN

## 2013-11-21 MED ORDER — MORPHINE SULFATE 2 MG/ML IJ SOLN
1.0000 mg | INTRAMUSCULAR | Status: DC | PRN
  Administered 2013-11-21: 4 mg via INTRAVENOUS
  Filled 2013-11-21: qty 2

## 2013-11-21 MED ORDER — MUPIROCIN 2 % EX OINT
TOPICAL_OINTMENT | CUTANEOUS | Status: AC
  Administered 2013-11-21: 1
  Filled 2013-11-21: qty 22

## 2013-11-21 MED ORDER — BUPIVACAINE HCL (PF) 0.25 % IJ SOLN
INTRAMUSCULAR | Status: DC | PRN
  Administered 2013-11-21: 6 mL

## 2013-11-21 MED ORDER — DEXAMETHASONE 4 MG PO TABS
4.0000 mg | ORAL_TABLET | Freq: Four times a day (QID) | ORAL | Status: DC
  Administered 2013-11-21 – 2013-11-22 (×3): 4 mg via ORAL
  Filled 2013-11-21 (×7): qty 1

## 2013-11-21 MED ORDER — LIDOCAINE HCL (CARDIAC) 20 MG/ML IV SOLN
INTRAVENOUS | Status: DC | PRN
  Administered 2013-11-21: 30 mg via INTRAVENOUS

## 2013-11-21 MED ORDER — ARTIFICIAL TEARS OP OINT
TOPICAL_OINTMENT | OPHTHALMIC | Status: DC | PRN
  Administered 2013-11-21: 1 via OPHTHALMIC

## 2013-11-21 MED ORDER — DIAZEPAM 5 MG/ML IJ SOLN
INTRAMUSCULAR | Status: AC
  Administered 2013-11-21: 2.5 mg
  Filled 2013-11-21: qty 2

## 2013-11-21 MED ORDER — ACETAMINOPHEN 650 MG RE SUPP: 650.0000 mg | RECTAL | Status: DC | PRN

## 2013-11-21 MED ORDER — POTASSIUM CHLORIDE IN NACL 20-0.9 MEQ/L-% IV SOLN
INTRAVENOUS | Status: DC
  Administered 2013-11-21: 18:00:00 via INTRAVENOUS
  Filled 2013-11-21 (×3): qty 1000

## 2013-11-21 MED ORDER — LACTATED RINGERS IV SOLN
INTRAVENOUS | Status: DC
  Administered 2013-11-21 (×2): via INTRAVENOUS

## 2013-11-21 MED ORDER — HYDROMORPHONE HCL PF 1 MG/ML IJ SOLN
0.2500 mg | INTRAMUSCULAR | Status: DC | PRN
  Administered 2013-11-21 (×4): 0.5 mg via INTRAVENOUS

## 2013-11-21 MED ORDER — FENTANYL CITRATE 0.05 MG/ML IJ SOLN
INTRAMUSCULAR | Status: DC | PRN
  Administered 2013-11-21: 50 ug via INTRAVENOUS
  Administered 2013-11-21: 100 ug via INTRAVENOUS
  Administered 2013-11-21 (×5): 50 ug via INTRAVENOUS

## 2013-11-21 MED ORDER — CELECOXIB 200 MG PO CAPS
200.0000 mg | ORAL_CAPSULE | Freq: Two times a day (BID) | ORAL | Status: DC
  Administered 2013-11-21: 200 mg via ORAL
  Filled 2013-11-21 (×3): qty 1

## 2013-11-21 MED ORDER — THROMBIN 5000 UNITS EX SOLR
OROMUCOSAL | Status: DC | PRN
  Administered 2013-11-21: 12:00:00 via TOPICAL

## 2013-11-21 MED ORDER — 0.9 % SODIUM CHLORIDE (POUR BTL) OPTIME
TOPICAL | Status: DC | PRN
  Administered 2013-11-21: 1000 mL

## 2013-11-21 MED ORDER — LISINOPRIL-HYDROCHLOROTHIAZIDE 20-12.5 MG PO TABS: 1.0000 | ORAL_TABLET | Freq: Every day | ORAL | Status: DC

## 2013-11-21 MED ORDER — ONDANSETRON HCL 4 MG/2ML IJ SOLN: 4.0000 mg | INTRAMUSCULAR | Status: DC | PRN

## 2013-11-21 MED ORDER — ALBUTEROL SULFATE HFA 108 (90 BASE) MCG/ACT IN AERS
INHALATION_SPRAY | RESPIRATORY_TRACT | Status: DC | PRN
  Administered 2013-11-21 (×2): 2 via RESPIRATORY_TRACT

## 2013-11-21 MED ORDER — MENTHOL 3 MG MT LOZG
1.0000 | LOZENGE | OROMUCOSAL | Status: DC | PRN
Start: 2013-11-21 — End: 2013-11-22

## 2013-11-21 MED ORDER — LISINOPRIL 20 MG PO TABS
20.0000 mg | ORAL_TABLET | Freq: Every day | ORAL | Status: DC
  Administered 2013-11-21: 20 mg via ORAL
  Filled 2013-11-21 (×2): qty 1

## 2013-11-21 MED ORDER — PHENOL 1.4 % MT LIQD: 1.0000 | OROMUCOSAL | Status: DC | PRN

## 2013-11-21 MED ORDER — HYDROCHLOROTHIAZIDE 12.5 MG PO CAPS
12.5000 mg | ORAL_CAPSULE | Freq: Every day | ORAL | Status: DC
  Administered 2013-11-21: 12.5 mg via ORAL
  Filled 2013-11-21 (×2): qty 1

## 2013-11-21 MED ORDER — SODIUM CHLORIDE 0.9 % IJ SOLN
3.0000 mL | Freq: Two times a day (BID) | INTRAMUSCULAR | Status: DC
  Administered 2013-11-21: 3 mL via INTRAVENOUS

## 2013-11-21 MED ORDER — MIDAZOLAM HCL 5 MG/5ML IJ SOLN
INTRAMUSCULAR | Status: DC | PRN
  Administered 2013-11-21: 2 mg via INTRAVENOUS

## 2013-11-21 MED ORDER — ALBUTEROL SULFATE (2.5 MG/3ML) 0.083% IN NEBU: 2.5000 mg | INHALATION_SOLUTION | Freq: Four times a day (QID) | RESPIRATORY_TRACT | Status: DC | PRN

## 2013-11-21 SURGICAL SUPPLY — 69 items
APL SKNCLS STERI-STRIP NONHPOA (GAUZE/BANDAGES/DRESSINGS) ×1
BAG DECANTER FOR FLEXI CONT (MISCELLANEOUS) ×3 IMPLANT
BAG URIMETER 350ML BARDEX IC (UROLOGICAL SUPPLIES) ×1
BAG URIMETER BARDEX IC 350 (UROLOGICAL SUPPLIES) ×1 IMPLANT
BENZOIN TINCTURE PRP APPL 2/3 (GAUZE/BANDAGES/DRESSINGS) ×3 IMPLANT
BLADE SURG ROTATE 9660 (MISCELLANEOUS) IMPLANT
BONE MATRIX OSTEOCEL PRO MED (Bone Implant) ×2 IMPLANT
BUR MATCHSTICK NEURO 3.0 LAGG (BURR) ×3 IMPLANT
CAGE COROENT PLIF 10X28-8 LUMB (Cage) ×4 IMPLANT
CANISTER SUCT 3000ML (MISCELLANEOUS) ×3 IMPLANT
CLIP NEUROVISION LG (CLIP) ×2 IMPLANT
CLOSURE WOUND 1/2 X4 (GAUZE/BANDAGES/DRESSINGS) ×1
CONT SPEC 4OZ CLIKSEAL STRL BL (MISCELLANEOUS) ×6 IMPLANT
COVER BACK TABLE 24X17X13 BIG (DRAPES) IMPLANT
COVER TABLE BACK 60X90 (DRAPES) ×3 IMPLANT
DRAPE C-ARM 42X72 X-RAY (DRAPES) ×3 IMPLANT
DRAPE C-ARMOR (DRAPES) ×3 IMPLANT
DRAPE LAPAROTOMY 100X72X124 (DRAPES) ×3 IMPLANT
DRAPE POUCH INSTRU U-SHP 10X18 (DRAPES) ×3 IMPLANT
DRAPE SURG 17X23 STRL (DRAPES) ×3 IMPLANT
DRESSING TELFA 8X3 (GAUZE/BANDAGES/DRESSINGS) ×3 IMPLANT
DRSG OPSITE 4X5.5 SM (GAUZE/BANDAGES/DRESSINGS) ×4 IMPLANT
DRSG OPSITE POSTOP 4X8 (GAUZE/BANDAGES/DRESSINGS) ×2 IMPLANT
DURAPREP 26ML APPLICATOR (WOUND CARE) ×3 IMPLANT
ELECT REM PT RETURN 9FT ADLT (ELECTROSURGICAL) ×3
ELECTRODE REM PT RTRN 9FT ADLT (ELECTROSURGICAL) ×1 IMPLANT
EVACUATOR 1/8 PVC DRAIN (DRAIN) ×3 IMPLANT
GAUZE SPONGE 4X4 16PLY XRAY LF (GAUZE/BANDAGES/DRESSINGS) IMPLANT
GLOVE BIO SURGEON STRL SZ8 (GLOVE) ×4 IMPLANT
GLOVE BIOGEL PI IND STRL 7.5 (GLOVE) IMPLANT
GLOVE BIOGEL PI IND STRL 8 (GLOVE) IMPLANT
GLOVE BIOGEL PI INDICATOR 7.5 (GLOVE) ×8
GLOVE BIOGEL PI INDICATOR 8 (GLOVE) ×4
GLOVE ECLIPSE 7.5 STRL STRAW (GLOVE) ×6 IMPLANT
GLOVE SURG SS PI 7.0 STRL IVOR (GLOVE) ×8 IMPLANT
GOWN BRE IMP SLV AUR LG STRL (GOWN DISPOSABLE) ×4 IMPLANT
GOWN BRE IMP SLV AUR XL STRL (GOWN DISPOSABLE) ×4 IMPLANT
GOWN STRL REIN 2XL LVL4 (GOWN DISPOSABLE) IMPLANT
HEMOSTAT POWDER KIT SURGIFOAM (HEMOSTASIS) ×2 IMPLANT
KIT BASIN OR (CUSTOM PROCEDURE TRAY) ×3 IMPLANT
KIT NDL NVM5 EMG ELECT (KITS) IMPLANT
KIT NEEDLE NVM5 EMG ELECT (KITS) ×1 IMPLANT
KIT NEEDLE NVM5 EMG ELECTRODE (KITS) ×2
KIT ROOM TURNOVER OR (KITS) ×3 IMPLANT
MILL MEDIUM DISP (BLADE) ×2 IMPLANT
NDL HYPO 25X1 1.5 SAFETY (NEEDLE) ×1 IMPLANT
NEEDLE HYPO 25X1 1.5 SAFETY (NEEDLE) ×3 IMPLANT
NS IRRIG 1000ML POUR BTL (IV SOLUTION) ×3 IMPLANT
PACK LAMINECTOMY NEURO (CUSTOM PROCEDURE TRAY) ×3 IMPLANT
PAD ARMBOARD 7.5X6 YLW CONV (MISCELLANEOUS) ×13 IMPLANT
ROD 50MM LUMBAR (Rod) ×4 IMPLANT
SCREW LOCK (Screw) ×12 IMPLANT
SCREW LOCK FXNS SPNE MAS PL (Screw) IMPLANT
SCREW SHANK 5.0X30MM (Screw) ×4 IMPLANT
SCREW SHANK 5.0X35 (Screw) ×4 IMPLANT
SCREW TULIP 5.5 (Screw) ×8 IMPLANT
SPONGE LAP 4X18 X RAY DECT (DISPOSABLE) IMPLANT
SPONGE SURGIFOAM ABS GEL 100 (HEMOSTASIS) ×3 IMPLANT
STRIP CLOSURE SKIN 1/2X4 (GAUZE/BANDAGES/DRESSINGS) ×3 IMPLANT
SUT VIC AB 0 CT1 18XCR BRD8 (SUTURE) ×1 IMPLANT
SUT VIC AB 0 CT1 8-18 (SUTURE) ×3
SUT VIC AB 2-0 CP2 18 (SUTURE) ×3 IMPLANT
SUT VIC AB 3-0 SH 8-18 (SUTURE) ×4 IMPLANT
SYR 20ML ECCENTRIC (SYRINGE) ×3 IMPLANT
SYR 3ML LL SCALE MARK (SYRINGE) IMPLANT
TOWEL OR 17X24 6PK STRL BLUE (TOWEL DISPOSABLE) ×3 IMPLANT
TOWEL OR 17X26 10 PK STRL BLUE (TOWEL DISPOSABLE) ×3 IMPLANT
TRAY FOLEY CATH 14FRSI W/METER (CATHETERS) ×3 IMPLANT
WATER STERILE IRR 1000ML POUR (IV SOLUTION) ×3 IMPLANT

## 2013-11-21 NOTE — Anesthesia Preprocedure Evaluation (Addendum)
Anesthesia Evaluation  Patient identified by MRN, date of birth, ID band Patient awake    Reviewed: Allergy & Precautions, H&P , NPO status , Patient's Chart, lab work & pertinent test results  Airway Mallampati: II      Dental   Pulmonary asthma , COPDCurrent Smoker,  breath sounds clear to auscultation        Cardiovascular hypertension, Rhythm:Regular Rate:Normal     Neuro/Psych    GI/Hepatic negative GI ROS, Neg liver ROS,   Endo/Other  Hypothyroidism   Renal/GU negative Renal ROS     Musculoskeletal  (+) Fibromyalgia -  Abdominal   Peds  Hematology   Anesthesia Other Findings   Reproductive/Obstetrics                          Anesthesia Physical Anesthesia Plan  ASA: III  Anesthesia Plan: General   Post-op Pain Management:    Induction: Intravenous  Airway Management Planned: Oral ETT  Additional Equipment:   Intra-op Plan:   Post-operative Plan: Possible Post-op intubation/ventilation  Informed Consent:   Plan Discussed with: CRNA, Anesthesiologist and Surgeon  Anesthesia Plan Comments:        Anesthesia Quick Evaluation

## 2013-11-21 NOTE — Anesthesia Postprocedure Evaluation (Signed)
  Anesthesia Post-op Note  Patient: Tammy Fuller  Procedure(s) Performed: Procedure(s): FOR MAXIMUM ACCESS (MAS) POSTERIOR LUMBAR INTERBODY FUSION (PLIF) 1 LEVEL,LUMBAR THREE-FOUR (N/A)  Patient Location: PACU  Anesthesia Type:General  Level of Consciousness: awake  Airway and Oxygen Therapy: Patient Spontanous Breathing  Post-op Pain: mild  Post-op Assessment: Post-op Vital signs reviewed  Post-op Vital Signs: Reviewed  Complications: No apparent anesthesia complications

## 2013-11-21 NOTE — Anesthesia Procedure Notes (Signed)
Procedure Name: Intubation Date/Time: 11/21/2013 10:37 AM Performed by: Elon AlasLEE, Matei Magnone BROWN Pre-anesthesia Checklist: Patient identified, Timeout performed, Emergency Drugs available, Suction available and Patient being monitored Patient Re-evaluated:Patient Re-evaluated prior to inductionOxygen Delivery Method: Circle system utilized Preoxygenation: Pre-oxygenation with 100% oxygen Intubation Type: IV induction Ventilation: Mask ventilation without difficulty Laryngoscope Size: Mac Grade View: Grade I Tube type: Oral Tube size: 7.5 mm Number of attempts: 1 Airway Equipment and Method: Stylet Placement Confirmation: positive ETCO2 and ETT inserted through vocal cords under direct vision Secured at: 22 cm Tube secured with: Tape Dental Injury: Teeth and Oropharynx as per pre-operative assessment

## 2013-11-21 NOTE — Progress Notes (Signed)
Called to take pt to room 3C06.  Was told they would have to call back they were receiving a patient from another unit.

## 2013-11-21 NOTE — Transfer of Care (Signed)
Immediate Anesthesia Transfer of Care Note  Patient: Tammy Fuller  Procedure(s) Performed: Procedure(s): FOR MAXIMUM ACCESS (MAS) POSTERIOR LUMBAR INTERBODY FUSION (PLIF) 1 LEVEL,LUMBAR THREE-FOUR (N/A)  Patient Location: PACU  Anesthesia Type:General  Level of Consciousness: awake, alert  and oriented  Airway & Oxygen Therapy: Patient Spontanous Breathing and Patient connected to nasal cannula oxygen  Post-op Assessment: Report given to PACU RN, Post -op Vital signs reviewed and stable and Patient moving all extremities X 4  Post vital signs: Reviewed and stable  Complications: No apparent anesthesia complications

## 2013-11-21 NOTE — Preoperative (Signed)
Beta Blockers   Reason not to administer Beta Blockers:Not Applicable 

## 2013-11-21 NOTE — Op Note (Signed)
11/21/2013  1:33 PM  PATIENT:  Tammy Fuller  43 y.o. 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 Decompressive lumbar laminectomy L3-4 requiring more work than would be required for a simple exposure of the disk for PLIF in order to adequately decompress the neural elements and address the spinal stenosis 2. Posterior lumbar interbody fusion L3-4 using PEEK interbody cages packed with morcellized allograft and autograft 3. Posterior fixation L3-4 using cortical pedicle screws.    SURGEON:  Marikay Alaravid Darleene Cumpian, MD  ASSISTANTS: Dr. Newell CoralNudelman  ANESTHESIA:  General  EBL: 125 ml  Total I/O In: 1600 [I.V.:1600] Out: 525 [Urine:400; Blood:125]  BLOOD ADMINISTERED:none  DRAINS: Hemovac   INDICATION FOR PROCEDURE: This patient presented with a long history of back pain and bilateral leg pain. She had an MRI which showed a spondylolisthesis with facet arthropathy at L3-4 spinal stenosis and a synovial cyst. Plain films with flexion extension views showed dynamic instability at L3-4. She tried medical management quite some time without significant relief. Recommended a PLIF at L3-4. Patient understood the risks, benefits, and alternatives and potential outcomes and wished to proceed.  PROCEDURE DETAILS:  The patient was brought to the operating room. After induction of generalized endotracheal anesthesia the patient was rolled into the prone position on chest rolls and all pressure points were padded. The patient's lumbar region was cleaned and then prepped with DuraPrep and draped in the usual sterile fashion. Anesthesia was injected and then a dorsal midline incision was made and carried down to the lumbosacral fascia. The fascia was opened and the paraspinous musculature was taken down in a subperiosteal fashion to expose L3-4. A  self-retaining retractor was placed. Intraoperative fluoroscopy confirmed my level, and I started with placement of the L3 cortical pedicle screws. The pedicle screw entry zones were identified utilizing surface landmarks and  AP and lateral fluoroscopy. I scored the cortex with the high-speed drill and then used the hand drill and EMG monitoring to drill an upward and outward direction into the pedicle. I then tapped line to line, and the tap was also monitored. I then placed a 5-0 x 35 mm cortical pedicle screw into the pedicles of L3 bilaterally. I then turned my attention to the decompression and the spinous process was removed and complete lumbar laminectomies, hemi- facetectomies, and foraminotomies were performed at L3. The patient had significant spinal stenosis and this required more work than would be required for a simple exposure of the disc for posterior lumbar interbody fusion. Much more generous decompression was undertaken in order to adequately decompress the neural elements and address the patient's leg pain. She also had a large synovial cyst at L3-4 on the right compressing the L3 and L4 nerve roots. I spent a suitable time teasing this away from the dura to remove it. The yellow ligament was removed to expose the underlying dura and nerve roots, and generous foraminotomies were performed to adequately decompress the neural elements. Both the exiting and traversing nerve roots were decompressed on both sides until a coronary dilator passed easily along the nerve roots. Once the decompression was complete, I turned my attention to the posterior lower lumbar interbody fusion. The epidural venous vasculature was coagulated and cut sharply. Disc space was incised and the initial discectomy was performed with pituitary rongeurs. The disc space was distracted with sequential distractors to  a height of 11 mm. We then used a series of scrapers and shavers to prepare the endplates for fusion. The midline  was prepared with Epstein curettes. Once the complete discectomy was finished, we packed an appropriate sized peek interbody cage with local autograft and morcellized allograft, gently retracted the nerve root, and tapped the cage into position at L3-4.  The midline between the cages was packed with morselized autograft and allograft. We then turned our attention to the placement of the lower pedicle screws. The pedicle screw entry zones were identified utilizing surface landmarks and fluoroscopy. I drilled into each pedicle utilizing the hand drill and EMG monitoring, and tapped each pedicle with the appropriate tap. We palpated with a ball probe to assure no break in the cortex. We then placed 5-0 x 30 mm cortical pedicle screws into the pedicles bilaterally at L4. We then placed lordotic rods into the multiaxial screw heads of the pedicle screws and locked these in position with the locking caps and anti-torque device. We then checked our construct with AP and lateral fluoroscopy. Irrigated with copious amounts of bacitracin-containing saline solution. Placed a medium Hemovac drain through separate stab incision. Inspected the nerve roots once again to assure adequate decompression, lined to the dura with Gelfoam, and closed the muscle and the fascia with 0 Vicryl. Closed the subcutaneous tissues with 2-0 Vicryl and subcuticular tissues with 3-0 Vicryl. The skin was closed with benzoin and Steri-Strips. Dressing was then applied, the patient was awakened from general anesthesia and transported to the recovery room in stable condition. At the end of the procedure all sponge, needle and instrument counts were correct.   PLAN OF CARE: Admit to inpatient   PATIENT DISPOSITION:  PACU - hemodynamically stable.   Delay start of Pharmacological VTE agent (>24hrs) due to surgical blood loss or risk of bleeding:  yes

## 2013-11-21 NOTE — H&P (Signed)
Subjective: Patient is a 43 y.o. female admitted for PLIF L3-4. Onset of symptoms was several months ago, gradually worsening since that time.  The pain is rated severe, and is located at the across the lower back and radiates to legs. The pain is described as aching and occurs intermittently. The symptoms have been progressive. Symptoms are exacerbated by exercise. MRI or CT showed stenosis/ synovial cyst, spondylolisthesis L3-4   Past Medical History  Diagnosis Date  . Thyroid disease   . Allergy   . Fibromyalgia   . Hypertension   . Hyperlipidemia   . Asthma   . Chronic low back pain   . Insomnia   . Peripheral edema   . Glaucoma     Hx: Of  . Arthritis     Past Surgical History  Procedure Laterality Date  . Cervical spine surgery      x2  . Anterior cruciate ligament repair Right   . Endometrial ablation    . Carpal tunnel release Bilateral   . Gangloin cyst Right   . Tubal ligation      Prior to Admission medications   Medication Sig Start Date End Date Taking? Authorizing Provider  albuterol (PROVENTIL HFA;VENTOLIN HFA) 108 (90 BASE) MCG/ACT inhaler Inhale 2 puffs into the lungs every 6 (six) hours as needed for wheezing. 05/20/13  Yes Mary-Margaret Daphine DeutscherMartin, FNP  cholecalciferol (VITAMIN D) 1000 UNITS tablet Take 1,000 Units by mouth daily.   Yes Historical Provider, MD  cyclobenzaprine (FLEXERIL) 10 MG tablet Take 1 tablet (10 mg total) by mouth 3 (three) times daily as needed for muscle spasms. 05/20/13  Yes Mary-Margaret Daphine DeutscherMartin, FNP  HYDROcodone-acetaminophen (NORCO) 7.5-325 MG per tablet Take 1 tablet by mouth every 6 (six) hours as needed for moderate pain.    Yes Historical Provider, MD  levothyroxine (SYNTHROID, LEVOTHROID) 125 MCG tablet Take 2 tablets (250 mcg total) by mouth daily before breakfast. 05/20/13  Yes Mary-Margaret Daphine DeutscherMartin, FNP  lisinopril-hydrochlorothiazide (PRINZIDE,ZESTORETIC) 20-12.5 MG per tablet Take 1 tablet by mouth daily. 05/20/13  Yes Mary-Margaret  Daphine DeutscherMartin, FNP  meloxicam (MOBIC) 15 MG tablet Take 1 tablet (15 mg total) by mouth daily. 05/20/13  Yes Mary-Margaret Daphine DeutscherMartin, FNP  simvastatin (ZOCOR) 40 MG tablet Take 1 tablet (40 mg total) by mouth at bedtime. 05/22/13  Yes Mary-Margaret Daphine DeutscherMartin, FNP  vitamin B-12 (CYANOCOBALAMIN) 1000 MCG tablet Take 1,000 mcg by mouth daily.   Yes Historical Provider, MD  vitamin C (ASCORBIC ACID) 500 MG tablet Take 500 mg by mouth daily.   Yes Historical Provider, MD   Allergies  Allergen Reactions  . Asa [Aspirin] Other (See Comments)    Reaction unknown    History  Substance Use Topics  . Smoking status: Current Every Day Smoker -- 1.00 packs/day    Types: Cigarettes  . Smokeless tobacco: Never Used  . Alcohol Use: Yes    Family History  Problem Relation Age of Onset  . Fibromyalgia Mother   . Hypertension Father   . Hyperlipidemia Father   . Diabetes Father   . Congestive Heart Failure Father   . Lupus Cousin      Review of Systems  Positive ROS: neg  All other systems have been reviewed and were otherwise negative with the exception of those mentioned in the HPI and as above.  Objective: Vital signs in last 24 hours: Temp:  [97.7 F (36.5 C)] 97.7 F (36.5 C) (01/15 0755) Pulse Rate:  [82] 82 (01/15 0755) Resp:  [18] 18 (01/15 0755) BP: (  127)/(100) 127/100 mmHg (01/15 0755) SpO2:  [98 %] 98 % (01/15 0853) Weight:  [97.183 kg (214 lb 4 oz)] 97.183 kg (214 lb 4 oz) (01/15 0755)  General Appearance: Alert, cooperative, no distress, appears stated age Head: Normocephalic, without obvious abnormality, atraumatic Eyes: PERRL, conjunctiva/corneas clear, EOM's intact    Neck: Supple, symmetrical, trachea midline Back: Symmetric, no curvature, ROM normal, no CVA tenderness Lungs:  respirations unlabored Heart: Regular rate and rhythm Abdomen: Soft, non-tender Extremities: Extremities normal, atraumatic, no cyanosis or edema Pulses: 2+ and symmetric all extremities Skin: Skin color,  texture, turgor normal, no rashes or lesions  NEUROLOGIC:   Mental status: Alert and oriented x4,  no aphasia, good attention span, fund of knowledge, and memory Motor Exam - grossly normal Sensory Exam - grossly normal Reflexes: 1+ Coordination - grossly normal Gait - grossly normal Balance - grossly normal Cranial Nerves: I: smell Not tested  II: visual acuity  OS: nl    OD: nl  II: visual fields Full to confrontation  II: pupils Equal, round, reactive to light  III,VII: ptosis None  III,IV,VI: extraocular muscles  Full ROM  V: mastication Normal  V: facial light touch sensation  Normal  V,VII: corneal reflex  Present  VII: facial muscle function - upper  Normal  VII: facial muscle function - lower Normal  VIII: hearing Not tested  IX: soft palate elevation  Normal  IX,X: gag reflex Present  XI: trapezius strength  5/5  XI: sternocleidomastoid strength 5/5  XI: neck flexion strength  5/5  XII: tongue strength  Normal    Data Review Lab Results  Component Value Date   WBC 7.2 11/21/2013   HGB 15.1* 11/21/2013   HCT 43.2 11/21/2013   MCV 96.4 11/21/2013   PLT 278 11/21/2013   Lab Results  Component Value Date   NA 139 11/21/2013   K 5.2 11/21/2013   CL 101 11/21/2013   CO2 27 11/21/2013   BUN 13 11/21/2013   CREATININE 0.73 11/21/2013   GLUCOSE 99 11/21/2013   Lab Results  Component Value Date   INR 0.91 11/21/2013    Assessment/Plan: Patient admitted for PLIF L3-4. Patient has failed a reasonable attempt at conservative therapy.  I explained the condition and procedure to the patient and answered any questions.  Patient wishes to proceed with procedure as planned. Understands risks/ benefits and typical outcomes of procedure.   Jafeth Mustin S 11/21/2013 10:33 AM

## 2013-11-22 MED ORDER — CYCLOBENZAPRINE HCL 10 MG PO TABS: 10.0000 mg | ORAL_TABLET | Freq: Three times a day (TID) | ORAL | Status: AC | PRN

## 2013-11-22 MED ORDER — OXYCODONE-ACETAMINOPHEN 5-325 MG PO TABS: 1.0000 | ORAL_TABLET | ORAL | Status: AC | PRN

## 2013-11-22 NOTE — Discharge Summary (Signed)
Physician Discharge Summary  Patient ID: Tammy Fuller MRN: [id number] DOB/AGE: 08-Nov-1970 43 y.o.  Admit date: 11/21/2013 Discharge date: 11/22/2013  Admission Diagnoses: spondylolisthesis/ stenosis L3-4   Discharge Diagnoses: same   Discharged Condition: good  Hospital Course: The patient was admitted on 11/21/2013 and taken to the operating room where the patient underwent PLIF L3-4. The patient tolerated the procedure well and was taken to the recovery room and then to the floor in stable condition. The hospital course was routine. There were no complications. The wound remained clean dry and intact. Pt had appropriate back soreness. No complaints of leg pain or new N/T/W. The patient remained afebrile with stable vital signs, and tolerated a regular diet. The patient continued to increase activities, and pain was well controlled with oral pain medications.   Consults: None  Significant Diagnostic Studies:  Results for orders placed during the hospital encounter of 11/21/13  SURGICAL PCR SCREEN      Result Value Range   MRSA, PCR NEGATIVE  NEGATIVE   Staphylococcus aureus POSITIVE (*) NEGATIVE  BASIC METABOLIC PANEL      Result Value Range   Sodium 139  137 - 147 mEq/L   Potassium 5.2  3.7 - 5.3 mEq/L   Chloride 101  96 - 112 mEq/L   CO2 27  19 - 32 mEq/L   Glucose, Bld 99  70 - 99 mg/dL   BUN 13  6 - 23 mg/dL   Creatinine, Ser 7.82  0.50 - 1.10 mg/dL   Calcium 9.2  8.4 - 95.6 mg/dL   GFR calc non Af Amer >90  >90 mL/min   GFR calc Af Amer >90  >90 mL/min  CBC WITH DIFFERENTIAL      Result Value Range   WBC 7.2  4.0 - 10.5 K/uL   RBC 4.48  3.87 - 5.11 MIL/uL   Hemoglobin 15.1 (*) 12.0 - 15.0 g/dL   HCT 21.3  08.6 - 57.8 %   MCV 96.4  78.0 - 100.0 fL   MCH 33.7  26.0 - 34.0 pg   MCHC 35.0  30.0 - 36.0 g/dL   RDW 46.9  62.9 - 52.8 %   Platelets 278  150 - 400 K/uL   Neutrophils Relative % 60  43 - 77 %   Neutro Abs 4.3  1.7 - 7.7 K/uL   Lymphocytes Relative 32   12 - 46 %   Lymphs Abs 2.3  0.7 - 4.0 K/uL   Monocytes Relative 6  3 - 12 %   Monocytes Absolute 0.4  0.1 - 1.0 K/uL   Eosinophils Relative 2  0 - 5 %   Eosinophils Absolute 0.1  0.0 - 0.7 K/uL   Basophils Relative 0  0 - 1 %   Basophils Absolute 0.0  0.0 - 0.1 K/uL  HCG, SERUM, QUALITATIVE      Result Value Range   Preg, Serum NEGATIVE  NEGATIVE  PROTIME-INR      Result Value Range   Prothrombin Time 12.1  11.6 - 15.2 seconds   INR 0.91  0.00 - 1.49  TYPE AND SCREEN      Result Value Range   ABO/RH(D) O POS     Antibody Screen NEG     Sample Expiration 11/24/2013    ABO/RH      Result Value Range   ABO/RH(D) O POS      Chest 2 View  11/21/2013   CLINICAL DATA:  spondylolisthesis / pre-op  EXAM: CHEST  2 VIEW  COMPARISON:  None.  FINDINGS: There is no focal parenchymal opacity, pleural effusion, or pneumothorax. The heart and mediastinal contours are unremarkable.  The osseous structures are unremarkable.  IMPRESSION: No active cardiopulmonary disease.   Electronically Signed   By: Elige Ko   On: 11/21/2013 09:38   Dg Lumbar Spine 2-3 Views  11/21/2013   CLINICAL DATA:  Posterior fusion  EXAM: LUMBAR SPINE - 2-3 VIEW; DG C-ARM 61-120 MIN  COMPARISON:  08/21/2013  FINDINGS: The lowest complete disc is labeled L5-S1. Bilateral pedicle screws and cross-table as in bar is are in place with a disc spacer at L3 and L4 for posterior fusion. No breakage or loosening of the hardware. Anatomic alignment.  IMPRESSION: L3-4 posterior fusion.   Electronically Signed   By: Maryclare Bean M.D.   On: 11/21/2013 14:12   Dg C-arm 61-120 Min  11/21/2013   CLINICAL DATA:  Posterior fusion  EXAM: LUMBAR SPINE - 2-3 VIEW; DG C-ARM 61-120 MIN  COMPARISON:  08/21/2013  FINDINGS: The lowest complete disc is labeled L5-S1. Bilateral pedicle screws and cross-table as in bar is are in place with a disc spacer at L3 and L4 for posterior fusion. No breakage or loosening of the hardware. Anatomic alignment.   IMPRESSION: L3-4 posterior fusion.   Electronically Signed   By: Maryclare Bean M.D.   On: 11/21/2013 14:12    Antibiotics:  Anti-infectives   Start     Dose/Rate Route Frequency Ordered Stop   11/21/13 2000  ceFAZolin (ANCEF) IVPB 1 g/50 mL premix     1 g 100 mL/hr over 30 Minutes Intravenous Every 8 hours 11/21/13 1634 11/22/13 0423   11/21/13 1149  bacitracin 50,000 Units in sodium chloride irrigation 0.9 % 500 mL irrigation  Status:  Discontinued       As needed 11/21/13 1149 11/21/13 1333   11/21/13 0600  ceFAZolin (ANCEF) IVPB 2 g/50 mL premix     2 g 100 mL/hr over 30 Minutes Intravenous On call to O.R. 11/20/13 1401 11/21/13 1044      Discharge Exam: Blood pressure 100/67, pulse 87, temperature 98.7 F (37.1 C), temperature source Oral, resp. rate 18, height 5\' 7"  (1.702 m), weight 97.183 kg (214 lb 4 oz), SpO2 96.00%. Neurologic: Grossly normal Incision CDI  Discharge Medications:     Medication List    STOP taking these medications       HYDROcodone-acetaminophen 7.5-325 MG per tablet  Commonly known as:  NORCO     meloxicam 15 MG tablet  Commonly known as:  MOBIC      TAKE these medications       albuterol 108 (90 BASE) MCG/ACT inhaler  Commonly known as:  PROVENTIL HFA;VENTOLIN HFA  Inhale 2 puffs into the lungs every 6 (six) hours as needed for wheezing.     cholecalciferol 1000 UNITS tablet  Commonly known as:  VITAMIN D  Take 1,000 Units by mouth daily.     cyclobenzaprine 10 MG tablet  Commonly known as:  FLEXERIL  Take 1 tablet (10 mg total) by mouth 3 (three) times daily as needed for muscle spasms.     levothyroxine 125 MCG tablet  Commonly known as:  SYNTHROID, LEVOTHROID  Take 2 tablets (250 mcg total) by mouth daily before breakfast.     lisinopril-hydrochlorothiazide 20-12.5 MG per tablet  Commonly known as:  PRINZIDE,ZESTORETIC  Take 1 tablet by mouth daily.     oxyCODONE-acetaminophen 5-325 MG per tablet  Commonly known as:  PERCOCET/ROXICET  Take 1-2 tablets by mouth every 4 (four) hours as needed for moderate pain.     simvastatin 40 MG tablet  Commonly known as:  ZOCOR  Take 1 tablet (40 mg total) by mouth at bedtime.     vitamin B-12 1000 MCG tablet  Commonly known as:  CYANOCOBALAMIN  Take 1,000 mcg by mouth daily.     vitamin C 500 MG tablet  Commonly known as:  ASCORBIC ACID  Take 500 mg by mouth daily.        Disposition: home   Final Dx: PLiF L3-4      Discharge Orders   Future Orders Complete By Expires   Call MD for:  difficulty breathing, headache or visual disturbances  As directed    Call MD for:  persistant nausea and vomiting  As directed    Call MD for:  redness, tenderness, or signs of infection (pain, swelling, redness, odor or green/yellow discharge around incision site)  As directed    Call MD for:  severe uncontrolled pain  As directed    Call MD for:  temperature >100.4  As directed    Diet - low sodium heart healthy  As directed    Discharge instructions  As directed    Comments:     No driving, no bending or twisting, may shower   Increase activity slowly  As directed       Follow-up Information   Follow up with Alphonse Asbridge S, MD In 2 weeks.   Specialty:  Neurosurgery   Contact information:   [address] [place] [address] [phone]        Signed: Tia AlertJONES,Lincoln Ginley S 11/22/2013, 8:28 AM

## 2013-11-22 NOTE — Progress Notes (Signed)
Pt. Alert and oriented,follows simple instructions, denies pain. Incision area without swelling, redness or S/S of infection. Voiding adequate clear yellow urine. Moving all extremities well and vitals stable and documented. Lumbar surgery notes instructions given to patient and family member for home safety and precautions. Pt. and family stated understanding of instructions given. Pain med given prior to discharged.

## 2013-11-22 NOTE — Progress Notes (Signed)
Utilization review completed.  

## 2013-11-22 NOTE — Evaluation (Signed)
Physical Therapy Evaluation Patient Details Name: Tammy Fuller MRN: [id number] DOB: 1971-06-12 Today's Date: 11/22/2013 Time: [id number] PT Time Calculation (min): 11 min  PT Assessment / Plan / Recommendation History of Present Illness  43 y.o. female [facility] for elective L4-5 PLIF.    Clinical Impression  Pt is POD #1 s/p lumbar spine surgery.  She is moving well and her husband is providing assist for her mobility. Education completed and pt has no further acute or f/u PT needs at this time.  PT to sign off.      PT Assessment  Patent does not need any further PT services    Follow Up Recommendations  No PT follow up;Supervision/Assistance - 24 hour    Does the patient have the potential to tolerate intense rehabilitation     NA  Barriers to Discharge   None      Equipment Recommendations  None recommended by PT    Recommendations for Other Services   None  Frequency   NA- One time eval and d/c   Precautions / Restrictions Precautions Precautions: Back Precaution Booklet Issued: Yes (comment) Precaution Comments: handout given and reviewed as well as functional examples of how to avoid back precautions.  Required Braces or Orthoses: Spinal Brace Spinal Brace: Applied in sitting position Restrictions Weight Bearing Restrictions: No   Pertinent Vitals/Pain See vitals flow sheet.       Mobility  Transfers Overall transfer level: Needs assistance Transfers: Sit to/from Stand Sit to Stand: Min guard General transfer comment: min guard assist for safety and balance during transitions.  Husband is providing support.  Ambulation/Gait Ambulation/Gait assistance: Min guard Ambulation Distance (Feet): 300 Feet Assistive device: 2 person hand held assist Gait Pattern/deviations: Step-through pattern Gait velocity: decreased Gait velocity interpretation: <1.8 ft/sec, indicative of risk for recurrent falls General Gait Details: guarded and  cautious, very light bil hand held assist to steady pt.  Stairs: Yes Stairs assistance: Min assist Stair Management: No rails;Step to pattern;Forwards (with husband's hand held assist. ) Number of Stairs: 1 General stair comments: pt leading with her left leg because it is the stronger/less painful leg.  Verbal cues for safest leg sequencing when going up and down the step.  Cues for safest guarding technique.          PT Goals(Current goals can be found in the care plan section) Acute Rehab PT Goals Patient Stated Goal: to get stronger and decrease pain level PT Goal Formulation: No goals set, d/c therapy  Visit Information  Last PT Received On: 11/22/13 Assistance Needed: +1 History of Present Illness: 43 y.o. [facility] for elective L4-5 PLIF.         Prior Functioning  Home Living Family/patient expects to be discharged to:: Private residence Living Arrangements: Spouse/significant other Available Help at Discharge: Family;Available 24 hours/day Type of Home: House Home Access: Ramped entrance (one step down into kitchen on inside of home) Home Layout: One level Home Equipment: Grab bars - tub/shower;Hand held shower head Prior Function Level of Independence: Independent Communication Communication: No difficulties    Cognition  Cognition Arousal/Alertness: Awake/alert Behavior During Therapy: WFL for tasks assessed/performed Overall Cognitive Status: Within Functional Limits for tasks assessed    Extremity/Trunk Assessment Upper Extremity Assessment Upper Extremity Assessment: Defer to OT evaluation Lower Extremity Assessment Lower Extremity Assessment: Generalized weakness Cervical / Trunk Assessment Cervical / Trunk Assessment: Normal   Balance General Comments General comments (skin integrity, edema, etc.): Pt  still has hemovac drain.   End of Session PT - End of Session Equipment Utilized During Treatment: Gait belt;Back  brace Activity Tolerance: Patient limited by pain Patient left: Other (comment) (with husband and OT)    Lurena Joiner B. Lynsee Wands, PT, DPT [phone]   11/22/2013, 10:39 AM

## 2013-11-22 NOTE — Progress Notes (Signed)
Occupational Therapy Evaluation Patient Details Name: Tammy Fuller MRN: [id number] DOB: 1971-09-17 Today's Date: 11/22/2013 Time: 4098-1191 OT Time Calculation (min): 30 min  OT Assessment / Plan / Recommendation History of present illness 43 y.o. female admitted to [facility] on 11/21/13 for elective L4-5 PLIF.     Clinical Impression   Completed all education regarding ADL and back precautions with DME and AE. Written information given. Pt/husband verbalized/demonstrted understanding. Pt needs 3 in 1 for home use.    OT Assessment  Patient does not need any further OT services    Follow Up Recommendations  No OT follow up    Barriers to Discharge      Equipment Recommendations  3 in 1 bedside comode    Recommendations for Other Services    Frequency       Precautions / Restrictions Precautions Precautions: Back Precaution Booklet Issued: Yes (comment) Precaution Comments: handout given and reviewed as well as functional examples of how to avoid back precautions.  Required Braces or Orthoses: Spinal Brace Spinal Brace: Applied in sitting position Restrictions Weight Bearing Restrictions: No   Pertinent Vitals/Pain no apparent distress     ADL  Grooming: Set up Where Assessed - Grooming: Unsupported standing Upper Body Bathing: Set up Where Assessed - Upper Body Bathing: Unsupported sitting Lower Body Bathing: Minimal assistance Where Assessed - Lower Body Bathing: Unsupported sit to stand Upper Body Dressing: Supervision/safety;Set up Where Assessed - Upper Body Dressing: Supported sitting Lower Body Dressing: Minimal assistance Where Assessed - Lower Body Dressing: Unsupported sit to stand Toilet Transfer: Supervision/safety Toilet Transfer Method: Sit to Barista: Comfort height toilet Toileting - Clothing Manipulation and Hygiene: Minimal assistance Where Assessed - Toileting Clothing Manipulation and Hygiene: Sit to stand from 3-in-1 or  toilet Equipment Used: Back brace;Reacher;Rolling walker;Sock aid;Long-handled sponge;Gait belt Transfers/Ambulation Related to ADLs: S ADL Comments: Completed ADL education regarding back precuations with ADL, use of AE and DME. Given handouts    OT Diagnosis:    OT Problem List:   OT Treatment Interventions:     OT Goals(Current goals can be found in the care plan section) Acute Rehab OT Goals Patient Stated Goal: to get stronger and decrease pain level  Visit Information  Last OT Received On: 11/22/13 Assistance Needed: +1 History of Present Illness: 43 y.o. female admitted to [facility] on 11/21/13 for elective L4-5 PLIF.         Prior Functioning     Home Living Family/patient expects to be discharged to:: Private residence Living Arrangements: Spouse/significant other Available Help at Discharge: Family;Available 24 hours/day Type of Home: House Home Access: Ramped entrance (one step down into kitchen on inside of home) Home Layout: One level Home Equipment: Grab bars - tub/shower;Hand held shower head Prior Function Level of Independence: Independent Communication Communication: No difficulties         Vision/Perception     Cognition  Cognition Arousal/Alertness: Awake/alert Behavior During Therapy: WFL for tasks assessed/performed Overall Cognitive Status: Within Functional Limits for tasks assessed    Extremity/Trunk Assessment Upper Extremity Assessment Upper Extremity Assessment: Defer to OT evaluation Lower Extremity Assessment Lower Extremity Assessment: Generalized weakness Cervical / Trunk Assessment Cervical / Trunk Assessment: Normal     Mobility Bed Mobility Overal bed mobility: Modified Independent Transfers Overall transfer level: Needs assistance Transfers: Sit to/from Stand Sit to Stand: Min guard General transfer comment: min guard assist for safety and balance during transitions.  Husband is providing support.      Exercise Other  Exercises Other Exercises: discussed normal activity progression s/p back surgery   Balance General Comments General comments (skin integrity, edema, etc.): Pt still has hemovac drain.    End of Session OT - End of Session Equipment Utilized During Treatment: Gait belt;Back brace Activity Tolerance: Patient tolerated treatment well Patient left: in chair;with call bell/phone within reach;with family/visitor present Nurse Communication: Mobility status;Other (comment) (need for 3/ in 1)  GO     Tishawna Larouche,HILLARY 11/22/2013, 12:32 PM [facility] Jakyrie Totherow, OTR/L  [phone] 11/22/2013

## 2013-11-26 ENCOUNTER — Encounter (HOSPITAL_COMMUNITY): Payer: Self-pay | Admitting: Neurological Surgery

## 2015-03-25 IMAGING — CR DG CHEST 2V
2 series · 2 of 2 positions shown · non-contrast
Comparison: None.

CLINICAL DATA: spondylolisthesis / pre-op

EXAM:
CHEST  2 VIEW

[w chest pa]
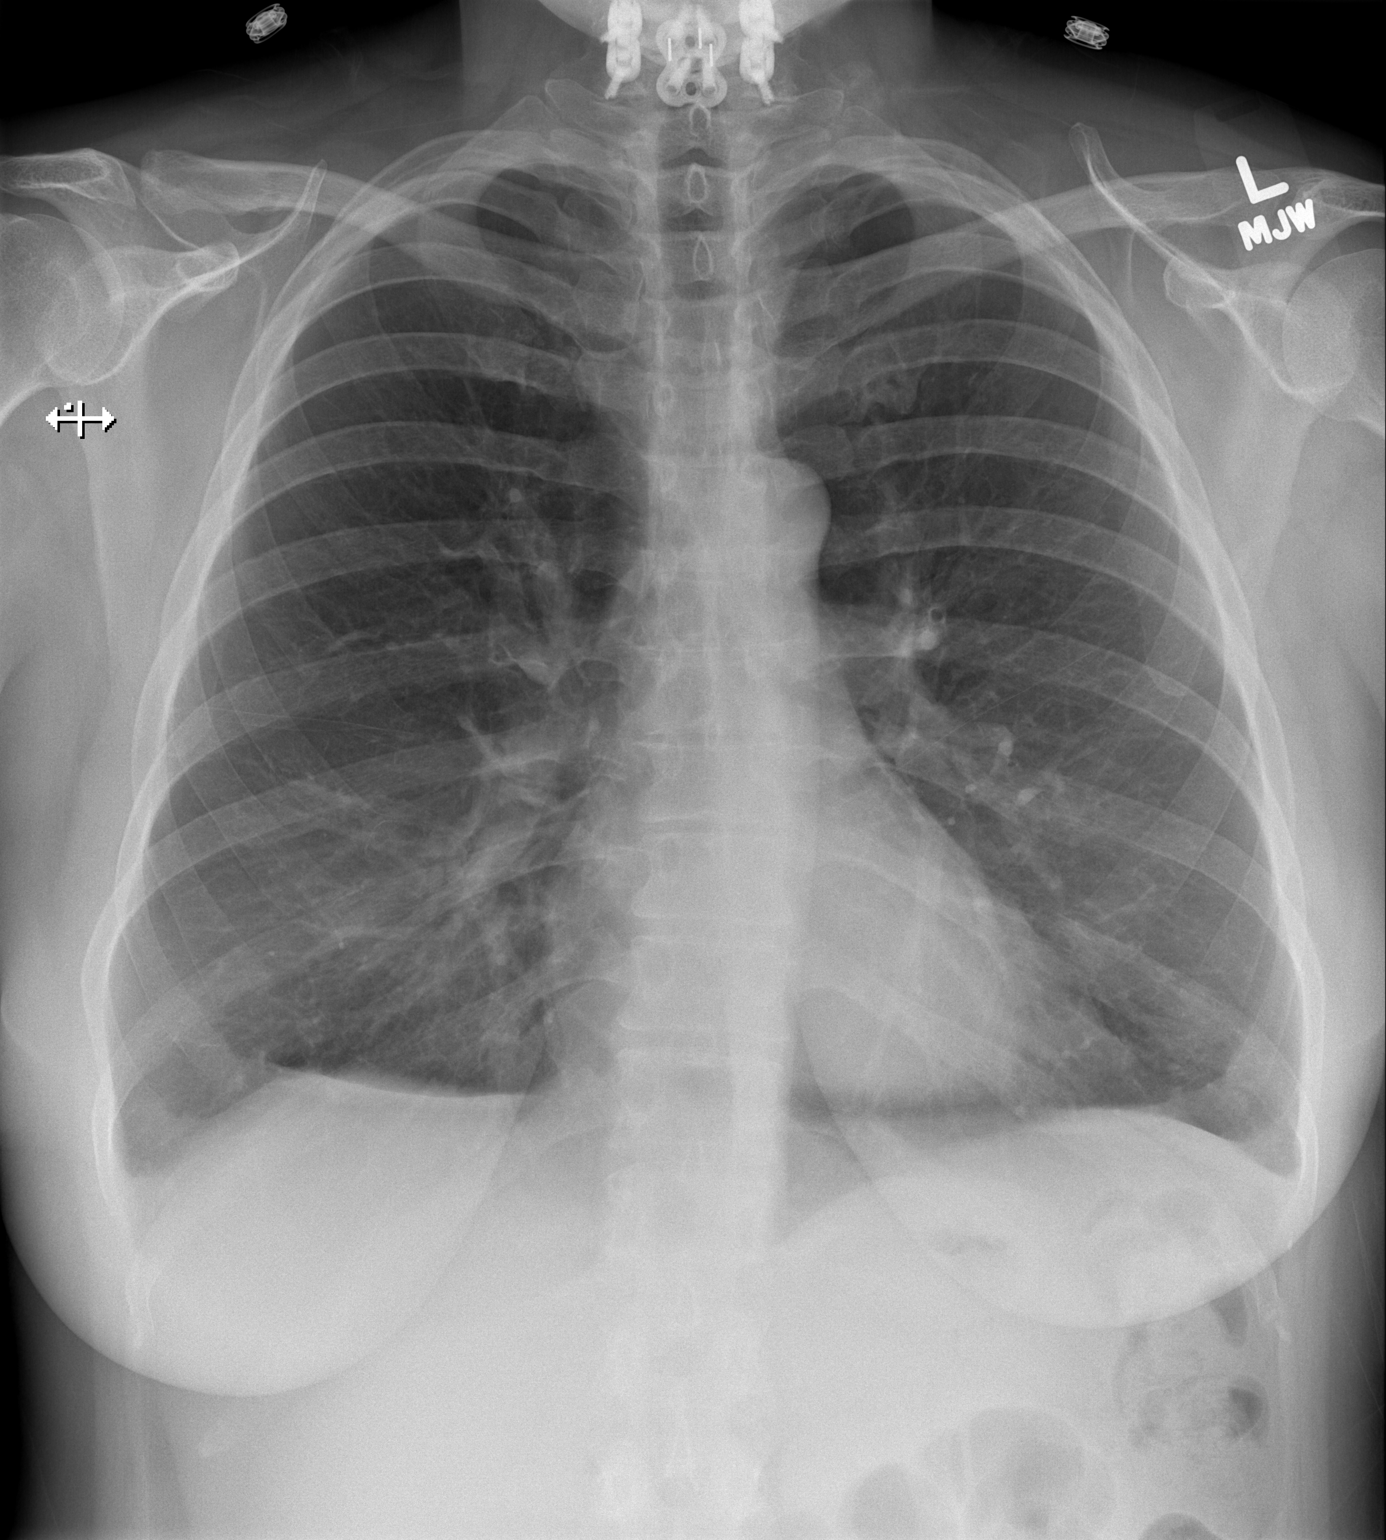

[w chest lat]
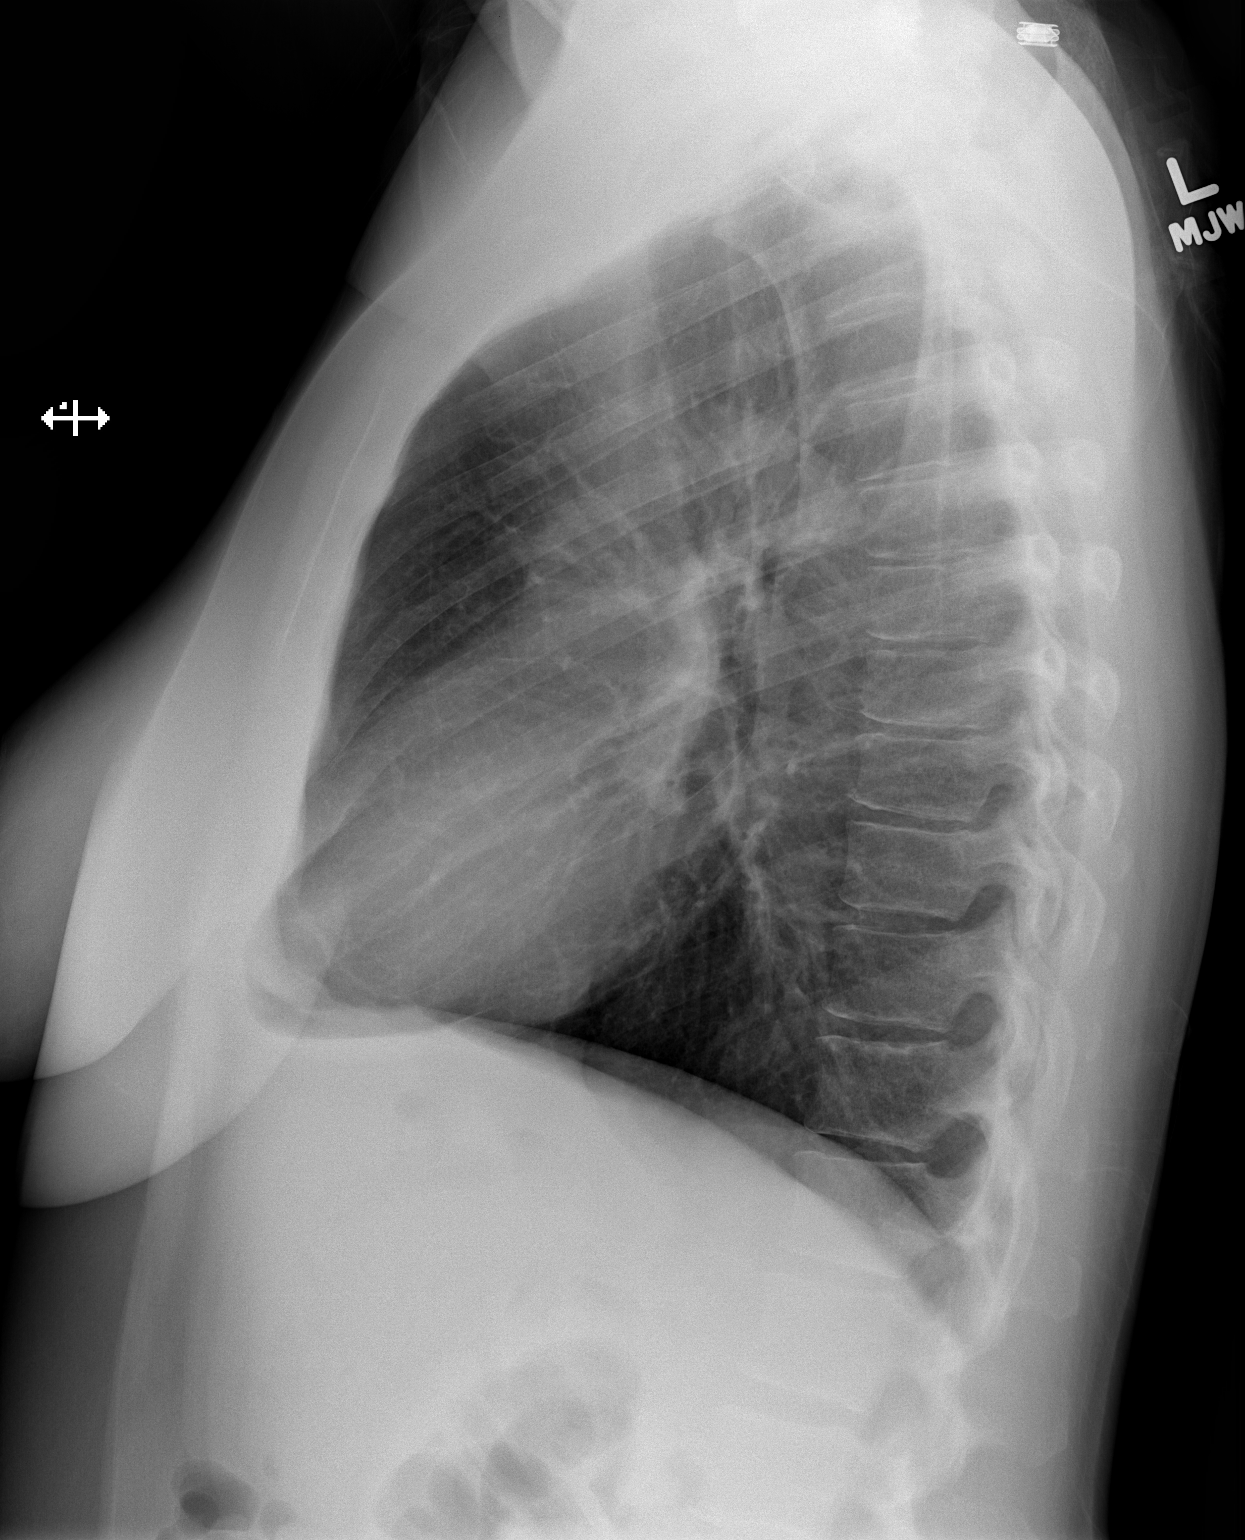

[2 of 2 positions shown; findings below may reference images not displayed]

FINDINGS: There is no focal parenchymal opacity, pleural effusion, or
pneumothorax. The heart and mediastinal contours are unremarkable.

The osseous structures are unremarkable.
IMPRESSION: No active cardiopulmonary disease.
